# Patient Record
Sex: Female | Born: 1950 | Race: White | Hispanic: No | Marital: Married | Smoking: Never smoker
Health system: Southern US, Community
[De-identification: ages and names within clinical notes are randomized; demographics above are authoritative.]

## PROBLEM LIST (undated history)

## (undated) DIAGNOSIS — E785 Hyperlipidemia, unspecified: Secondary | ICD-10-CM

## (undated) DIAGNOSIS — I10 Essential (primary) hypertension: Secondary | ICD-10-CM

## (undated) DIAGNOSIS — J189 Pneumonia, unspecified organism: Secondary | ICD-10-CM

## (undated) DIAGNOSIS — M1990 Unspecified osteoarthritis, unspecified site: Secondary | ICD-10-CM

## (undated) DIAGNOSIS — E559 Vitamin D deficiency, unspecified: Secondary | ICD-10-CM

## (undated) HISTORY — DX: Hyperlipidemia, unspecified: E78.5

## (undated) HISTORY — PX: OTHER SURGICAL HISTORY: SHX169

## (undated) HISTORY — DX: Essential (primary) hypertension: I10

## (undated) HISTORY — PX: LAPAROSCOPY: SHX197

## (undated) HISTORY — PX: TMJ ARTHROSCOPY: SHX1067

---

## 1976-06-27 HISTORY — PX: BREAST EXCISIONAL BIOPSY: SUR124

## 1999-03-17 ENCOUNTER — Other Ambulatory Visit: Admission: RE | Admit: 1999-03-17 | Discharge: 1999-03-17 | Payer: Self-pay | Admitting: Gynecology

## 1999-03-19 ENCOUNTER — Other Ambulatory Visit: Admission: RE | Admit: 1999-03-19 | Discharge: 1999-03-19 | Payer: Self-pay | Admitting: Gynecology

## 1999-08-27 ENCOUNTER — Other Ambulatory Visit: Admission: RE | Admit: 1999-08-27 | Discharge: 1999-08-27 | Payer: Self-pay | Admitting: Gynecology

## 1999-09-21 ENCOUNTER — Other Ambulatory Visit: Admission: RE | Admit: 1999-09-21 | Discharge: 1999-09-21 | Payer: Self-pay | Admitting: Gynecology

## 2000-04-28 ENCOUNTER — Other Ambulatory Visit: Admission: RE | Admit: 2000-04-28 | Discharge: 2000-04-28 | Payer: Self-pay | Admitting: Gynecology

## 2001-08-28 ENCOUNTER — Other Ambulatory Visit: Admission: RE | Admit: 2001-08-28 | Discharge: 2001-08-28 | Payer: Self-pay | Admitting: Gynecology

## 2002-07-20 ENCOUNTER — Ambulatory Visit (HOSPITAL_COMMUNITY): Admission: RE | Admit: 2002-07-20 | Discharge: 2002-07-20 | Payer: Self-pay | Admitting: Neurology

## 2002-07-20 ENCOUNTER — Encounter: Payer: Self-pay | Admitting: Neurology

## 2002-08-28 ENCOUNTER — Other Ambulatory Visit: Admission: RE | Admit: 2002-08-28 | Discharge: 2002-08-28 | Payer: Self-pay | Admitting: Gynecology

## 2004-01-12 ENCOUNTER — Other Ambulatory Visit: Admission: RE | Admit: 2004-01-12 | Discharge: 2004-01-12 | Payer: Self-pay | Admitting: Gynecology

## 2005-06-16 ENCOUNTER — Other Ambulatory Visit: Admission: RE | Admit: 2005-06-16 | Discharge: 2005-06-16 | Payer: Self-pay | Admitting: Gynecology

## 2006-09-04 ENCOUNTER — Other Ambulatory Visit: Admission: RE | Admit: 2006-09-04 | Discharge: 2006-09-04 | Payer: Self-pay | Admitting: Gynecology

## 2010-12-14 ENCOUNTER — Other Ambulatory Visit: Payer: Self-pay | Admitting: Orthopedic Surgery

## 2010-12-14 DIAGNOSIS — M25511 Pain in right shoulder: Secondary | ICD-10-CM

## 2011-01-03 ENCOUNTER — Ambulatory Visit
Admission: RE | Admit: 2011-01-03 | Discharge: 2011-01-03 | Disposition: A | Discharge status: Home / Self Care | Payer: BC Managed Care – PPO | Source: Ambulatory Visit | Attending: Orthopedic Surgery | Admitting: Orthopedic Surgery

## 2011-01-03 ENCOUNTER — Other Ambulatory Visit: Payer: Self-pay

## 2011-01-03 DIAGNOSIS — M25511 Pain in right shoulder: Secondary | ICD-10-CM

## 2016-06-14 DIAGNOSIS — E785 Hyperlipidemia, unspecified: Secondary | ICD-10-CM | POA: Insufficient documentation

## 2016-06-14 DIAGNOSIS — I10 Essential (primary) hypertension: Secondary | ICD-10-CM | POA: Insufficient documentation

## 2016-08-22 ENCOUNTER — Other Ambulatory Visit: Payer: Self-pay | Admitting: Physician Assistant

## 2016-08-22 DIAGNOSIS — Z1231 Encounter for screening mammogram for malignant neoplasm of breast: Secondary | ICD-10-CM

## 2016-09-07 ENCOUNTER — Encounter: Payer: Self-pay | Admitting: Radiology

## 2016-09-07 ENCOUNTER — Ambulatory Visit
Admission: RE | Admit: 2016-09-07 | Discharge: 2016-09-07 | Disposition: A | Discharge status: Home / Self Care | Payer: BLUE CROSS/BLUE SHIELD | Source: Ambulatory Visit | Attending: Physician Assistant | Admitting: Physician Assistant

## 2016-09-07 DIAGNOSIS — Z1231 Encounter for screening mammogram for malignant neoplasm of breast: Secondary | ICD-10-CM

## 2018-05-22 ENCOUNTER — Other Ambulatory Visit: Payer: Self-pay | Admitting: Nurse Practitioner

## 2018-05-22 ENCOUNTER — Ambulatory Visit: Payer: Self-pay | Admitting: Nurse Practitioner

## 2018-05-22 VITALS — BP 128/80 | HR 94 | Temp 98.3°F | Resp 16 | Ht 64.0 in | Wt 194.5 lb

## 2018-05-22 DIAGNOSIS — J069 Acute upper respiratory infection, unspecified: Secondary | ICD-10-CM

## 2018-05-22 DIAGNOSIS — H6591 Unspecified nonsuppurative otitis media, right ear: Secondary | ICD-10-CM

## 2018-05-22 MED ORDER — FEXOFENADINE HCL 180 MG PO TABS: 180.0000 mg | ORAL_TABLET | Freq: Every day | ORAL | Status: DC

## 2018-05-22 MED ORDER — AZITHROMYCIN 250 MG PO TABS: ORAL_TABLET | ORAL | 0 refills | Status: DC

## 2018-05-22 MED ORDER — FLUTICASONE PROPIONATE 50 MCG/ACT NA SUSP: NASAL | 0 refills | Status: DC

## 2018-05-22 NOTE — Progress Notes (Signed)
Saw patient earlier today and was given written zpack Rx. Reports that Rx written Rx given will not allow pharmacist to change to generic. She is to do the 72 hour WASP and if ineffective with other treatment regimen to start this med and take as directed. Understands this and [facility]

## 2018-05-22 NOTE — Patient Instructions (Addendum)
If no improvement in 72 hours take zpack as directed (handwritten Rx given) Increase fluids and rest Keep hands washed and houuse wiped down with lysol Take meds as directed RTC if symptoms persist or worsen

## 2018-05-22 NOTE — Progress Notes (Signed)
   Subjective:    Patient ID: Susan Snow, female    DOB: September 07, 1950, 67 y.o.   MRN: [id number]  HPI Susan Snow comes to the clinic today for c/o headache, bilateral ear pressure worse on the left, and PND x 2 days. Pressure is worse when she lies down. She denies any self treatment, fever or dizziness.    Review of Systems  Constitutional: Negative for fatigue and fever.  HENT: Positive for postnasal drip. Negative for ear pain, hearing loss, nosebleeds, sinus pressure and sinus pain.        Bilateral ear pressure  Neurological: Negative for dizziness.       Objective:   Physical Exam  Constitutional: She is oriented to person, place, and time. She appears well-developed and well-nourished.  HENT:  Head: Normocephalic and atraumatic.  Right Ear: External ear normal.  Left Ear: External ear normal.  Nose: Nose normal.  No frontal or maxillary sinus tenderness on palpation Bilateral ears with serous fluid to intact TM and bulging and right mildly injected Pharynx: mildly injected with cobblestoning  Neck: Normal range of motion. Neck supple. No tracheal deviation present.  Cardiovascular: Normal rate, regular rhythm and normal heart sounds.  Pulmonary/Chest: Effort normal and breath sounds normal. No respiratory distress.  Abdominal: Soft. Bowel sounds are normal.  Musculoskeletal: Normal range of motion.  Lymphadenopathy:    She has cervical adenopathy.  Neurological: She is alert and oriented to person, place, and time.  Skin: Skin is warm and dry.  Psychiatric: She has a normal mood and affect.  Vitals reviewed.         Assessment & Plan:

## 2019-05-26 ENCOUNTER — Encounter (HOSPITAL_COMMUNITY): Payer: Self-pay | Admitting: Pharmacy Technician

## 2019-05-26 ENCOUNTER — Ambulatory Visit (INDEPENDENT_AMBULATORY_CARE_PROVIDER_SITE_OTHER)
Admission: EM | Admit: 2019-05-26 | Discharge: 2019-05-26 | Disposition: A | Discharge status: Other Acute Inpatient Hospital | Payer: PRIVATE HEALTH INSURANCE | Source: Home / Self Care

## 2019-05-26 ENCOUNTER — Other Ambulatory Visit: Payer: Self-pay

## 2019-05-26 ENCOUNTER — Encounter (HOSPITAL_COMMUNITY): Payer: Self-pay

## 2019-05-26 ENCOUNTER — Inpatient Hospital Stay (HOSPITAL_COMMUNITY)
Admission: EM | Admit: 2019-05-26 | Discharge: 2019-05-29 | DRG: 483 | Disposition: A | Discharge status: Home / Self Care | Payer: PRIVATE HEALTH INSURANCE | Attending: Orthopaedic Surgery | Admitting: Orthopaedic Surgery

## 2019-05-26 ENCOUNTER — Ambulatory Visit (INDEPENDENT_AMBULATORY_CARE_PROVIDER_SITE_OTHER): Payer: PRIVATE HEALTH INSURANCE

## 2019-05-26 DIAGNOSIS — I10 Essential (primary) hypertension: Secondary | ICD-10-CM | POA: Diagnosis present

## 2019-05-26 DIAGNOSIS — Z09 Encounter for follow-up examination after completed treatment for conditions other than malignant neoplasm: Secondary | ICD-10-CM

## 2019-05-26 DIAGNOSIS — S43015A Anterior dislocation of left humerus, initial encounter: Secondary | ICD-10-CM | POA: Diagnosis present

## 2019-05-26 DIAGNOSIS — M25512 Pain in left shoulder: Secondary | ICD-10-CM | POA: Diagnosis not present

## 2019-05-26 DIAGNOSIS — S43005A Unspecified dislocation of left shoulder joint, initial encounter: Secondary | ICD-10-CM

## 2019-05-26 DIAGNOSIS — S42142A Displaced fracture of glenoid cavity of scapula, left shoulder, initial encounter for closed fracture: Principal | ICD-10-CM | POA: Diagnosis present

## 2019-05-26 DIAGNOSIS — Z79899 Other long term (current) drug therapy: Secondary | ICD-10-CM

## 2019-05-26 DIAGNOSIS — S4292XA Fracture of left shoulder girdle, part unspecified, initial encounter for closed fracture: Secondary | ICD-10-CM | POA: Diagnosis present

## 2019-05-26 DIAGNOSIS — W19XXXA Unspecified fall, initial encounter: Secondary | ICD-10-CM

## 2019-05-26 DIAGNOSIS — W010XXA Fall on same level from slipping, tripping and stumbling without subsequent striking against object, initial encounter: Secondary | ICD-10-CM | POA: Diagnosis present

## 2019-05-26 DIAGNOSIS — Z7982 Long term (current) use of aspirin: Secondary | ICD-10-CM

## 2019-05-26 DIAGNOSIS — S43006A Unspecified dislocation of unspecified shoulder joint, initial encounter: Secondary | ICD-10-CM | POA: Diagnosis present

## 2019-05-26 DIAGNOSIS — E785 Hyperlipidemia, unspecified: Secondary | ICD-10-CM | POA: Diagnosis present

## 2019-05-26 DIAGNOSIS — Z20828 Contact with and (suspected) exposure to other viral communicable diseases: Secondary | ICD-10-CM | POA: Diagnosis present

## 2019-05-26 MED ORDER — PROPOFOL 10 MG/ML IV BOLUS
0.5000 mg/kg | Freq: Once | INTRAVENOUS | Status: DC
  Filled 2019-05-26: qty 20

## 2019-05-26 MED ORDER — FENTANYL CITRATE (PF) 100 MCG/2ML IJ SOLN
50.0000 ug | Freq: Once | INTRAMUSCULAR | Status: AC
  Administered 2019-05-26: 50 ug via INTRAVENOUS
  Filled 2019-05-26: qty 2

## 2019-05-26 MED ORDER — FENTANYL CITRATE (PF) 100 MCG/2ML IJ SOLN
50.0000 ug | Freq: Once | INTRAMUSCULAR | Status: AC
  Administered 2019-05-29 (×2): 50 ug via INTRAVENOUS
  Filled 2019-05-26: qty 2

## 2019-05-26 MED ORDER — PROPOFOL 10 MG/ML IV BOLUS
INTRAVENOUS | Status: AC | PRN
  Administered 2019-05-26 (×2): 30 mg via INTRAVENOUS
  Administered 2019-05-26: 20 mg via INTRAVENOUS
  Administered 2019-05-26: 50 mg via INTRAVENOUS

## 2019-05-26 NOTE — ED Triage Notes (Signed)
Pt states she was walking and almost feel she caught and hurt her left shoulder this afternoon. Pt sates having left shoulder pain, left arm pain and numbness in her left axilla.

## 2019-05-26 NOTE — ED Provider Notes (Signed)
[facility]    CSN: [id number] Arrival date & time: 05/26/19  1658      History   Chief Complaint Chief Complaint  Patient presents with  . Shoulder Pain  . Arm Pain  . Numbness    HPI Susan Snow is a 68 y.o. female.   This is an initial visit for this 68 year old woman who fell this afternoon.  Pt states she was walking and almost feel she caught and hurt her left shoulder this afternoon. Pt sates having left shoulder pain, left arm pain and numbness in her left axilla.   Pain is excruciating and patient is unable to move her shoulder without significant pain.  No other injuries reported.     History reviewed. No pertinent past medical history.  Patient Active Problem List   Diagnosis Date Noted  . Hyperlipidemia 06/14/2016  . Hypertension 06/14/2016    Past Surgical History:  Procedure Laterality Date  . BREAST EXCISIONAL BIOPSY Right 1978    OB History   No obstetric history on file.      Home Medications    Prior to Admission medications   Medication Sig Start Date End Date Taking? Authorizing Provider  aspirin EC 81 MG tablet Take by mouth.    [provider]  Calcium Carbonate-Vitamin D 600-200 MG-UNIT TABS Take by mouth.    [provider]  fexofenadine (ALLEGRA) 180 MG tablet Take 1 tablet (180 mg total) by mouth daily. 05/22/18   Marnette Burgess, NP  fluticasone (FLONASE) 50 MCG/ACT nasal spray 1 spray to both nostrils twice daily until symptoms resolve 05/22/18   Marnette Burgess, NP  olmesartan (BENICAR) 20 MG tablet Take 20 mg by mouth daily. 02/16/18   [provider]  simvastatin (ZOCOR) 40 MG tablet Take 40 mg by mouth daily. 02/16/18   [provider]  vitamin B-12 (CYANOCOBALAMIN) 500 MCG tablet Take 500 mcg by mouth daily.    [provider]    Family History History reviewed. No pertinent family history.  Social History Social History   Tobacco Use  . Smoking  status: Never Smoker  . Smokeless tobacco: Never Used  Substance Use Topics  . Alcohol use: Yes    Alcohol/week: 1.0 standard drinks    Types: 1 Glasses of wine per week  . Drug use: Not on file     Allergies   Patient has no known allergies.   Review of Systems Review of Systems  All other systems reviewed and are negative.    Physical Exam Triage Vital Signs ED Triage Vitals  Enc Vitals Group     BP 05/26/19 1730 (!) 152/94     Pulse Rate 05/26/19 1730 77     Resp 05/26/19 1730 17     Temp 05/26/19 1730 97.6 F (36.4 C)     Temp Source 05/26/19 1730 Oral     SpO2 05/26/19 1730 96 %     Weight --      Height --      Head Circumference --      Peak Flow --      Pain Score 05/26/19 1727 10     Pain Loc --      Pain Edu? --      Excl. in GC? --    No data found.  Updated Vital Signs BP (!) 152/94 (BP Location: Right Arm)   Pulse 77   Temp 97.6 F (36.4 C) (Oral)   Resp 17   SpO2 96%  Physical Exam Vitals signs and nursing note reviewed.  Constitutional:      Appearance: Normal appearance. She is obese.  Neck:     Musculoskeletal: Normal range of motion and neck supple.  Cardiovascular:     Rate and Rhythm: Normal rate.  Pulmonary:     Effort: Pulmonary effort is normal.  Musculoskeletal:        General: Swelling, tenderness and signs of injury present.     Comments: Unable to move left shoulder without pain.  Diffusely tender posterior and anterior joint line with some moderate swelling apparent.  Skin:    General: Skin is warm and dry.  Neurological:     General: No focal deficit present.     Mental Status: She is alert.  Psychiatric:        Mood and Affect: Mood normal.        Behavior: Behavior normal.      UC Treatments / Results  Labs (all labs ordered are listed, but only abnormal results are displayed) Labs Reviewed - No data to display  EKG   Radiology Left shoulder film:  Dislocated with suspected nondisplaced humerus fx  Procedures Procedures (including critical care time)  Medications Ordered in UC Medications - No data to display  Initial Impression / Assessment and Plan / UC Course  I have reviewed the triage vital signs and the nursing notes.  Pertinent labs & imaging results that were available during my care of the patient were reviewed by me and considered in my medical decision making (see chart for details).    Final Clinical Impressions(s) / UC Diagnoses   Final diagnoses:  Dislocated shoulder, left, initial encounter     Discharge Instructions     Patient sent directly to ED with nurses calling ahead    ED Prescriptions    None     I have reviewed the PDMP during this encounter.   Robyn Haber, MD 05/26/19 1815

## 2019-05-26 NOTE — ED Triage Notes (Signed)
Pt arrives from UC with reports of +L shoulder dislocation and 2 fx after mechanical fall today. CNS intact.

## 2019-05-26 NOTE — ED Provider Notes (Signed)
[facility] EMERGENCY DEPARTMENT Provider Note   CSN: [id number] Arrival date & time: 05/26/19  1819     History   Chief Complaint Chief Complaint  Patient presents with   Shoulder Pain    +dislocation    HPI Susan Snow is a 68 y.o. female.     Susan Snow is a 68 y.o. female with a history of hypertension, hyperlipidemia, who presents to the ED from urgent care for evaluation of left shoulder dislocation with associated fractures noted on x-ray.  Patient reports that around 1:00 today she was at a store, she was walking up a ramp and tripped over a tread strip causing her to fall forward, she caught herself on her outstretched left arm and had immediate pain to the left shoulder.  Since then she has been unable to move the shoulder.  She initially went home and tried to take Tylenol and ice it but pain did not improve and she did not have improvement in her mobility so she presented to urgent care.  She has had some intermittent tingling in the arm but no numbness.  She denies any other injuries from the fall.  Did not hit her head, denies neck pain.  Reports that she bumped her left knee but has been ambulatory on the knee without difficulty and denies swelling in this area.  Aside from Tylenol no other meds prior to arrival to treat pain.  She has had previous rotator cuff surgery on the right but no previous injury or surgery to the left shoulder.  She has previously seen Dr. Madelon Lips with [facility].  Last ate and drank at noon.     History reviewed. No pertinent past medical history.  Patient Active Problem List   Diagnosis Date Noted   Hyperlipidemia 06/14/2016   Hypertension 06/14/2016    Past Surgical History:  Procedure Laterality Date   BREAST EXCISIONAL BIOPSY Right 1978     OB History   No obstetric history on file.      Home Medications    Prior to Admission medications   Medication Sig Start Date End Date Taking?  Authorizing Provider  acetaminophen (TYLENOL) 325 MG tablet Take 650 mg by mouth once.   Yes [provider]  aspirin EC 81 MG tablet Take 81 mg by mouth every morning.    Yes [provider]  Multiple Vitamins-Minerals (ADULT ONE DAILY GUMMIES) CHEW Chew 2 tablets by mouth every morning.   Yes [provider]  Multiple Vitamins-Minerals (PRESERVISION AREDS 2) CAPS Take 1 capsule by mouth every other day.   Yes [provider]  naproxen sodium (ALEVE) 220 MG tablet Take 220 mg by mouth at bedtime as needed (pain).   Yes [provider]  olmesartan (BENICAR) 20 MG tablet Take 20 mg by mouth every morning.  02/16/18  Yes [provider]  simvastatin (ZOCOR) 40 MG tablet Take 40 mg by mouth at bedtime.  02/16/18  Yes [provider]  vitamin B-12 (CYANOCOBALAMIN) 500 MCG tablet Take 500 mcg by mouth every morning.    Yes [provider]  fexofenadine (ALLEGRA) 180 MG tablet Take 1 tablet (180 mg total) by mouth daily. Patient not taking: Reported on 05/26/2019 05/22/18   Marnette Burgess, NP  fluticasone [facility]) 50 MCG/ACT nasal spray 1 spray to both nostrils twice daily until symptoms resolve Patient not taking: Reported on 05/26/2019 05/22/18   Marnette Burgess, NP    Family History No family history on  file.  Social History Social History   Tobacco Use   Smoking status: Never Smoker   Smokeless tobacco: Never Used  Substance Use Topics   Alcohol use: Yes    Alcohol/week: 1.0 standard drinks    Types: 1 Glasses of wine per week   Drug use: Not on file     Allergies   Patient has no known allergies.   Review of Systems Review of Systems  Constitutional: Negative for chills and fever.  Musculoskeletal: Positive for arthralgias and joint swelling. Negative for back pain and neck pain.  Skin: Negative for color change and wound.  Neurological: Negative for syncope, light-headedness and headaches.  All  other systems reviewed and are negative.    Physical Exam Updated Vital Signs BP (!) 166/91 (BP Location: Right Arm)    Pulse 78    Temp 98 F (36.7 C) (Oral)    Resp 20    Ht 5\' 5"  (1.651 m)    Wt 86.2 kg    SpO2 98%    BMI 31.62 kg/m   Physical Exam Vitals signs and nursing note reviewed.  Constitutional:      General: She is not in acute distress.    Appearance: Normal appearance. She is well-developed and normal weight. She is not ill-appearing or diaphoretic.  HENT:     Head: Normocephalic and atraumatic.  Eyes:     General:        Right eye: No discharge.        Left eye: No discharge.  Neck:     Musculoskeletal: Neck supple. No muscular tenderness.     Comments: No midline C-spine tenderness. Cardiovascular:     Rate and Rhythm: Normal rate and regular rhythm.     Pulses: Normal pulses.     Heart sounds: Normal heart sounds.  Pulmonary:     Effort: Pulmonary effort is normal. No respiratory distress.     Breath sounds: Normal breath sounds.     Comments: Respirations equal and unlabored, patient able to speak in full sentences, lungs clear to auscultation bilaterally Musculoskeletal:     Comments: Tenderness to palpation over the left shoulder with obvious swelling and deformity.  Pain is most significant over the superior shoulder and deltoid.  Unable to move shoulder.  Denies pain at the elbow forearm or wrist.  2+ radial pulse.  Normal sensation.  5/5 grip strength.  Skin:    General: Skin is warm and dry.     Capillary Refill: Capillary refill takes less than 2 seconds.  Neurological:     Mental Status: She is alert and oriented to person, place, and time.     Coordination: Coordination normal.  Psychiatric:        Mood and Affect: Mood normal.        Behavior: Behavior normal.      ED Treatments / Results  Labs (all labs ordered are listed, but only abnormal results are displayed) Labs Reviewed - No data to display  EKG None  Radiology Dg Shoulder  Left  Result Date: 05/26/2019 CLINICAL DATA:  Pain after fall EXAM: LEFT SHOULDER - 2+ VIEW COMPARISON:  None. FINDINGS: An anterior dislocation is identified. There is irregularity near the greater tubercle as well as 2 linear lucencies projected over the proximal humerus. There is also irregularity of the glenoid, particularly inferiorly. A lipohemarthrosis is seen on oblique imaging with a fat fluid level. The clavicle is intact. There is mild irregularity of the acromion as well. The remainder  of the scapula is intact. Limited views of the left chest are normal. IMPRESSION: 1. Anterior dislocation. 2. Large fat fluid level in the left shoulder consistent with a lipohemarthrosis consistent with an underlying fracture. There is irregularity of the greater tubercle as well as 2 linear lucencies projected over the proximal humerus suggesting fracture of the proximal humerus. There is irregularity of the glenoid as well suggesting fracture. There is irregularity of the a chromium which is less specific. Recommend CT imaging for better evaluation. Electronically Signed   By: Gerome Samavid  Williams III M.D   On: 05/26/2019 18:14   Dg Shoulder Left Portable  Result Date: 05/27/2019 CLINICAL DATA:  Postreduction EXAM: LEFT SHOULDER COMPARISON:  05/26/2019 FINDINGS: Interval reduction of the previously seen dislocated left shoulder. Large Hill-Sachs deformity noted in the superolateral left humeral head. IMPRESSION: Interval reduction.  Large Hill-Sachs deformity. Electronically Signed   By: Charlett NoseKevin  Dover M.D.   On: 05/27/2019 00:10    Procedures .Ortho Injury Treatment  Date/Time: 05/27/2019 12:28 AM Performed by: Dartha LodgeFord, Cienna Dumais N, PA-C Authorized by: Dartha LodgeFord, Raeleigh Guinn N, PA-C   Consent:    Consent obtained:  Verbal   Consent given by:  Patient   Risks discussed:  Fracture, recurrent dislocation, irreducible dislocation, stiffness, restricted joint movement, nerve damage and vascular damage   Alternatives  discussed:  No treatmentInjury location: shoulder Location details: left shoulder Injury type: fracture-dislocation Dislocation type: anterior Fracture type: greater humeral tuberosity Pre-procedure neurovascular assessment: neurovascularly intact Pre-procedure distal perfusion: normal Pre-procedure neurological function: normal Pre-procedure range of motion: reduced  Anesthesia: Local anesthesia used: no  Patient sedated: Yes. Refer to sedation procedure documentation for details of sedation. Manipulation performed: yes Skeletal traction used: yes Reduction successful: yes X-ray confirmed reduction: yes Immobilization: sling (Left shoulder immobilizer) Post-procedure neurovascular assessment: post-procedure neurovascularly intact Post-procedure distal perfusion: normal Post-procedure neurological function: normal Post-procedure range of motion comment: Shoulder unstable so ROM avoided Patient tolerance: patient tolerated the procedure well with no immediate complications    (including critical care time)  Medications Ordered in ED Medications  fentaNYL (SUBLIMAZE) injection 50 mcg (50 mcg Intravenous Given 05/26/19 2236)     Initial Impression / Assessment and Plan / ED Course  I have reviewed the triage vital signs and the nursing notes.  Pertinent labs & imaging results that were available during my care of the patient were reviewed by me and considered in my medical decision making (see chart for details).  68 year old female presents from urgent care for evaluation of left anterior shoulder dislocation associated with fracture after mechanical fall today.  X-rays completed at urgent care show an anterior dislocation as well as a large fat fluid level consistent with lipohemarthrosis, there is an irregularity of the greater tubercle as well as 2 linear lucencies over the proximal humerus suggestive of fracture.  There is also an irregularity of the glenoid suggesting  fracture as well.  Patient given fentanyl for pain which is described as a 9 out of 10.  She is unable to move the shoulder but has 2+ distal pulses and normal sensation.  Will discuss with orthopedics prior to reduction or further imaging.  Case discussed with Dr. Carola FrostHandy with orthopedics who reviewed patient's x-rays and recommends attempting left shoulder reduction with good sedation.  After post reduction films that confirm shoulder is in place he recommends getting CT of the left shoulder to better evaluate potential areas of fracture.  He reports this will be more valuable to have post-reduction.  Discussed this plan with  the patient who is in agreement.  Conscious sedation performed by Dr. Madilyn Hook with propofol.  Left shoulder reduction performed by myself and Dr. Madilyn Hook. Shoulder is unstable, and was held in place until immobilizer applied. Post reduction films show successful reduction with large Hill-Sachs deformity of the superior lateral left humeral head.  Patient placed in shoulder immobilizer by myself and Ortho tech. CT left shoulder ordered to better characterize associated fracture.  Care signed out to PA Ivar Drape who will follow up on patient's left shoulder CT and discussed with Dr. Carola Frost with orthopedics.  Anticipate discharge with pain medication, discussed with patient also using Tylenol and topical Voltaren gel.  Final Clinical Impressions(s) / ED Diagnoses   Final diagnoses:  Closed fracture dislocation of left shoulder, initial encounter  Fall, initial encounter    ED Discharge Orders    None       Dartha Lodge, [place] 05/27/19 0041    Tilden Fossa, MD 05/28/19 [phone]

## 2019-05-26 NOTE — Discharge Instructions (Addendum)
Patient sent directly to ED with nurses calling ahead

## 2019-05-26 NOTE — ED Notes (Signed)
Patient is being discharged from the [facility] and sent to the Emergency Department via personal vehicle by spouse. Per Dr Joseph Art, patient is stable but in need of higher level of care due to shoulder dislocation. Patient is aware and verbalizes understanding of plan of care.   Vitals:   05/26/19 1730  BP: (!) 152/94  Pulse: 77  Resp: 17  Temp: 97.6 F (36.4 C)  SpO2: 96%

## 2019-05-27 ENCOUNTER — Emergency Department (HOSPITAL_COMMUNITY): Payer: PRIVATE HEALTH INSURANCE

## 2019-05-27 ENCOUNTER — Encounter (HOSPITAL_COMMUNITY): Payer: Self-pay | Admitting: Emergency Medicine

## 2019-05-27 ENCOUNTER — Inpatient Hospital Stay (HOSPITAL_COMMUNITY): Payer: PRIVATE HEALTH INSURANCE

## 2019-05-27 DIAGNOSIS — I10 Essential (primary) hypertension: Secondary | ICD-10-CM | POA: Diagnosis present

## 2019-05-27 DIAGNOSIS — W010XXA Fall on same level from slipping, tripping and stumbling without subsequent striking against object, initial encounter: Secondary | ICD-10-CM | POA: Diagnosis present

## 2019-05-27 DIAGNOSIS — S42142A Displaced fracture of glenoid cavity of scapula, left shoulder, initial encounter for closed fracture: Secondary | ICD-10-CM | POA: Diagnosis present

## 2019-05-27 DIAGNOSIS — Z20828 Contact with and (suspected) exposure to other viral communicable diseases: Secondary | ICD-10-CM | POA: Diagnosis present

## 2019-05-27 DIAGNOSIS — E785 Hyperlipidemia, unspecified: Secondary | ICD-10-CM | POA: Diagnosis present

## 2019-05-27 DIAGNOSIS — Z7982 Long term (current) use of aspirin: Secondary | ICD-10-CM | POA: Diagnosis not present

## 2019-05-27 DIAGNOSIS — S43006A Unspecified dislocation of unspecified shoulder joint, initial encounter: Secondary | ICD-10-CM | POA: Diagnosis present

## 2019-05-27 DIAGNOSIS — Z79899 Other long term (current) drug therapy: Secondary | ICD-10-CM | POA: Diagnosis not present

## 2019-05-27 DIAGNOSIS — S43015A Anterior dislocation of left humerus, initial encounter: Secondary | ICD-10-CM | POA: Diagnosis present

## 2019-05-27 DIAGNOSIS — M25512 Pain in left shoulder: Secondary | ICD-10-CM | POA: Diagnosis present

## 2019-05-27 DIAGNOSIS — S4292XA Fracture of left shoulder girdle, part unspecified, initial encounter for closed fracture: Secondary | ICD-10-CM | POA: Diagnosis present

## 2019-05-27 LAB — CBC
HCT: 40.9 % (ref 36.0–46.0)
Hemoglobin: 13.9 g/dL (ref 12.0–15.0)
MCH: 32.6 pg (ref 26.0–34.0)
MCHC: 34 g/dL (ref 30.0–36.0)
MCV: 95.8 fL (ref 80.0–100.0)
Platelets: 242 10*3/uL (ref 150–400)
RBC: 4.27 MIL/uL (ref 3.87–5.11)
RDW: 11.3 % — ABNORMAL LOW (ref 11.5–15.5)
WBC: 8.4 10*3/uL (ref 4.0–10.5)
nRBC: 0 % (ref 0.0–0.2)

## 2019-05-27 LAB — BASIC METABOLIC PANEL
Anion gap: 12 (ref 5–15)
BUN: 11 mg/dL (ref 8–23)
CO2: 24 mmol/L (ref 22–32)
Calcium: 8.9 mg/dL (ref 8.9–10.3)
Chloride: 106 mmol/L (ref 98–111)
Creatinine, Ser: 0.67 mg/dL (ref 0.44–1.00)
GFR calc Af Amer: >60 mL/min (ref >60)
GFR calc non Af Amer: >60 mL/min (ref >60)
Glucose, Bld: 114 mg/dL — ABNORMAL HIGH (ref 70–99)
Potassium: 3.9 mmol/L (ref 3.5–5.1)
Sodium: 142 mmol/L (ref 135–145)

## 2019-05-27 LAB — MRSA PCR SCREENING: MRSA by PCR: NEGATIVE

## 2019-05-27 LAB — SARS CORONAVIRUS 2 (TAT 6-24 HRS): SARS Coronavirus 2: NEGATIVE

## 2019-05-27 MED ORDER — FENTANYL CITRATE (PF) 100 MCG/2ML IJ SOLN
50.0000 ug | Freq: Once | INTRAMUSCULAR | Status: AC
  Administered 2019-05-27: 02:00:00 50 ug via INTRAVENOUS
  Filled 2019-05-27: qty 2

## 2019-05-27 MED ORDER — OLMESARTAN MEDOXOMIL 20 MG PO TABS
20.0000 mg | ORAL_TABLET | Freq: Every day | ORAL | Status: DC
  Administered 2019-05-28 – 2019-05-29 (×2): 20 mg via ORAL
  Filled 2019-05-27 (×4): qty 1

## 2019-05-27 MED ORDER — PNEUMOCOCCAL VAC POLYVALENT 25 MCG/0.5ML IJ INJ
0.5000 mL | INJECTION | INTRAMUSCULAR | Status: DC
  Filled 2019-05-27: qty 0.5

## 2019-05-27 MED ORDER — DICLOFENAC SODIUM 1 % EX GEL: 2.0000 g | Freq: Four times a day (QID) | CUTANEOUS | 0 refills | Status: DC

## 2019-05-27 MED ORDER — HYDROMORPHONE HCL 1 MG/ML IJ SOLN
0.5000 mg | INTRAMUSCULAR | Status: AC | PRN
  Administered 2019-05-27 (×3): 0.5 mg via INTRAVENOUS
  Filled 2019-05-27 (×3): qty 1

## 2019-05-27 MED ORDER — DEXTROSE-NACL 5-0.45 % IV SOLN
INTRAVENOUS | Status: AC
  Administered 2019-05-27: 04:00:00 via INTRAVENOUS

## 2019-05-27 MED ORDER — NON FORMULARY: 20.0000 mg | Freq: Every day | Status: DC

## 2019-05-27 MED ORDER — ONDANSETRON HCL 4 MG/2ML IJ SOLN
4.0000 mg | Freq: Three times a day (TID) | INTRAMUSCULAR | Status: AC | PRN
  Administered 2019-05-27: 4 mg via INTRAVENOUS
  Filled 2019-05-27: qty 2

## 2019-05-27 NOTE — H&P (View-Only) (Signed)
ORTHOPAEDIC CONSULTATION  REQUESTING PHYSICIAN: Hiram Gash, MD  Chief Complaint: shoulder pain  HPI: Susan Snow is a 68 y.o. female with a past medical history of hypertension and hyperlipidemia.   Patient was walking outside the store at around 1:00 today and tripped over a tread strip causing her to fall forward. She caught herself on her outstretched left arm and had immediate pain to the left shoulder. She presented to the Urgent Care for evaluation. X-rays showed left shoulder dislocation. She was sent to ED. Shoulder was successfully reduced in the ED. Patient was placed in shoulder immobilizer and admitted to orthopedic service.   Upon evaluation, patient is resting comfortably in her hospital bed. Sling is in place. She reports stiffness of her shoulder, but no significant pain. She has previously had a rotator cuff repair on the right side by Dr. French Ana years ago.   History reviewed. No pertinent past medical history. Past Surgical History:  Procedure Laterality Date  . BREAST EXCISIONAL BIOPSY Right 1978   Social History   Socioeconomic History  . Marital status: Married    Spouse name: Not on file  . Number of children: Not on file  . Years of education: Not on file  . Highest education level: Not on file  Occupational History  . Not on file  Social Needs  . Financial resource strain: Not on file  . Food insecurity    Worry: Not on file    Inability: Not on file  . Transportation needs    Medical: Not on file    Non-medical: Not on file  Tobacco Use  . Smoking status: Never Smoker  . Smokeless tobacco: Never Used  Substance and Sexual Activity  . Alcohol use: Yes    Alcohol/week: 1.0 standard drinks    Types: 1 Glasses of wine per week  . Drug use: Not on file  . Sexual activity: Not on file  Lifestyle  . Physical activity    Days per week: Not on file    Minutes per session: Not on file  . Stress: Not on file  Relationships  . Social  Herbalist on phone: Not on file    Gets together: Not on file    Attends religious service: Not on file    Active member of club or organization: Not on file    Attends meetings of clubs or organizations: Not on file    Relationship status: Not on file  Other Topics Concern  . Not on file  Social History Narrative  . Not on file   No family history on file. No Known Allergies Prior to Admission medications   Medication Sig Start Date End Date Taking? Authorizing Provider  acetaminophen (TYLENOL) 325 MG tablet Take 650 mg by mouth once.   Yes [provider]  aspirin EC 81 MG tablet Take 81 mg by mouth every morning.    Yes [provider]  Multiple Vitamins-Minerals (ADULT ONE DAILY GUMMIES) CHEW Chew 2 tablets by mouth every morning.   Yes [provider]  Multiple Vitamins-Minerals (PRESERVISION AREDS 2) CAPS Take 1 capsule by mouth every other day.   Yes [provider]  naproxen sodium (ALEVE) 220 MG tablet Take 220 mg by mouth at bedtime as needed (pain).   Yes [provider]  olmesartan (BENICAR) 20 MG tablet Take 20 mg by mouth every morning.  02/16/18  Yes [provider]  simvastatin (ZOCOR) 40 MG tablet  Take 40 mg by mouth at bedtime.  02/16/18  Yes [provider]  vitamin B-12 (CYANOCOBALAMIN) 500 MCG tablet Take 500 mcg by mouth every morning.    Yes [provider]  diclofenac Sodium (VOLTAREN) 1 % GEL Apply 2 g topically 4 (four) times daily. 05/27/19   Jacqlyn Larsen, PA-C  fexofenadine (ALLEGRA) 180 MG tablet Take 1 tablet (180 mg total) by mouth daily. Patient not taking: Reported on 05/26/2019 05/22/18   Maury Dus, NP  fluticasone [facility]) 50 MCG/ACT nasal spray 1 spray to both nostrils twice daily until symptoms resolve Patient not taking: Reported on 05/26/2019 05/22/18   Maury Dus, NP   Ct Shoulder Left Wo Contrast  Result Date: 05/27/2019 CLINICAL DATA:   Dislocation, fall, shoulder fracture EXAM: CT OF THE UPPER LEFT EXTREMITY WITHOUT CONTRAST TECHNIQUE: Multidetector CT imaging of the upper left extremity was performed according to the standard protocol. COMPARISON:  Radiograph same day FINDINGS: Bones/Joint/Cartilage The patient is status post reduction of anterior dislocation. The humeral head is well seated within the glenoid. There is a large Hill-Sachs deformity with small osseous fragments seen posteriorly. Tiny osseous fragments are seen within the superior glenohumeral joint space the largest measuring 6 mm seen on series 3, image 30, which could represent chip fractures from the posterior glenoid surface or displaced fragments from the Hill-Sachs deformity. There is a comminuted mildly displaced osseous Bankart lesion which involve the posteroinferior glenoid surface as well. Osseous fragment measuring 9 mm is seen in the posteroinferior glenohumeral joint space. The [facility] joint is intact. No other fractures are identified. A moderate glenohumeral joint effusion is seen. Ligaments Suboptimally assessed by CT. Muscles and Tendons There edema involving the deltoid musculature. Within the subscapularis muscle belly there is a 2.9 cm fat containing lesion which could represent intramuscular lipoma or focal fatty atrophy. The tendons appear to be grossly intact, however suboptimally visualized. Soft tissues There is soft tissue edema surrounding the anterior shoulder and within the axilla. IMPRESSION: 1. Status post reduction of anterior dislocation. 2. Comminuted impacted Hill-Sachs deformity with adjacent small osseous fragments. 3. Tiny osseous fragments in the superior glenohumeral joint space the largest measuring 6 mm which could represent chip fractures from the posterior glenoid surface or displaced osseous fragment from the Hill-Sachs deformity. 4. Comminuted mildly displaced osseous Bankart lesion that also of involves the posterior inferior glenoid  surface. 5. Adjacent osseous fragment measuring 9 mm seen in the posteroinferior glenohumeral joint space. 6. 2.9 cm fat containing lesion within subscapularis muscle belly which could represent intramuscular lipoma or focal fatty atrophy. Electronically Signed   By: Prudencio Pair M.D.   On: 05/27/2019 01:13   Dg Shoulder Left  Result Date: 05/26/2019 CLINICAL DATA:  Pain after fall EXAM: LEFT SHOULDER - 2+ VIEW COMPARISON:  None. FINDINGS: An anterior dislocation is identified. There is irregularity near the greater tubercle as well as 2 linear lucencies projected over the proximal humerus. There is also irregularity of the glenoid, particularly inferiorly. A lipohemarthrosis is seen on oblique imaging with a fat fluid level. The clavicle is intact. There is mild irregularity of the acromion as well. The remainder of the scapula is intact. Limited views of the left chest are normal. IMPRESSION: 1. Anterior dislocation. 2. Large fat fluid level in the left shoulder consistent with a lipohemarthrosis consistent with an underlying fracture. There is irregularity of the greater tubercle as well as 2 linear lucencies projected over the proximal humerus suggesting fracture of the proximal humerus. There  is irregularity of the glenoid as well suggesting fracture. There is irregularity of the a chromium which is less specific. Recommend CT imaging for better evaluation. Electronically Signed   By: Dorise Bullion III M.D   On: 05/26/2019 18:14   Dg Shoulder Left Portable  Result Date: 05/27/2019 CLINICAL DATA:  Postreduction EXAM: LEFT SHOULDER COMPARISON:  05/26/2019 FINDINGS: Interval reduction of the previously seen dislocated left shoulder. Large Hill-Sachs deformity noted in the superolateral left humeral head. IMPRESSION: Interval reduction.  Large Hill-Sachs deformity. Electronically Signed   By: Rolm Baptise M.D.   On: 05/27/2019 00:10   Family History Reviewed and non-contributory, no pertinent history  of problems with bleeding or anesthesia      Review of Systems 14 system ROS conducted and negative except for that noted in HPI   OBJECTIVE  Vitals: General: Alert, no acute distress Cardiovascular: Warm extremities noted Respiratory: No cyanosis, no use of accessory musculature GI: No organomegaly, abdomen is soft and non-tender Skin: No lesions in the area of chief complaint other than those listed below in MSK exam.  Neurologic: Sensation intact distally save for the below mentioned MSK exam Psychiatric: Patient is competent for consent with normal mood and affect Lymphatic: No swelling obvious and reported other than the area involved in the exam below  Extremities: LUE: Sling well fitting. ROM not tested in setting of recent dislocation.  Full and painless ROM throughout hand with DPC of 0. + Motor in  AIN, PIN, Ulnar distributions. Axillary nerve sensation preserved and symmetric.  Sensation intact in medial, radial, and ulnar distributions. Well perfused digits.   Patient moves all other extremities without any pain or difficulty.      Labs cbc Recent Labs    05/27/19 0347  WBC 8.4  HGB 13.9  HCT 40.9  PLT 242    Labs inflam No results for input(s): CRP in the last 72 hours.  Invalid input(s): ESR  Labs coag No results for input(s): INR, PTT in the last 72 hours.  Invalid input(s): PT  Recent Labs    05/27/19 0347  NA 142  K 3.9  CL 106  CO2 24  GLUCOSE 114*  BUN 11  CREATININE 0.67  CALCIUM 8.9     ASSESSMENT AND PLAN: 68 y.o. female with the following: status post reduction of anterior left shoulder dislocation  - Weight Bearing Status/Activity: NWB LUE. Sling.  - Additional recommended tests: MRI left shoulder  - Follow-up plan: Pending results of MRI of the left shoulder.  Patient may require surgery.    Noemi Chapel 05/27/2019

## 2019-05-27 NOTE — Progress Notes (Signed)
Orthopedic Tech Progress Note Patient Details:  Susan Snow 08/24/1950 [id number]  Ortho Devices Type of Ortho Device: Sling immobilizer Ortho Device/Splint Location: lue Ortho Device/Splint Interventions: Ordered, Application, Adjustment   Post Interventions Patient Tolerated: Well Instructions Provided: Care of device, Adjustment of device   Karolee Stamps 05/27/2019, 12:47 AM

## 2019-05-27 NOTE — ED Provider Notes (Signed)
.  Sedation  Date/Time: 05/27/2019 12:22 AM Performed by: Quintella Reichert, MD Authorized by: Quintella Reichert, MD   Consent:    Consent obtained:  Verbal   Consent given by:  Patient   Risks discussed:  Allergic reaction, dysrhythmia, inadequate sedation, nausea, prolonged hypoxia resulting in organ damage, prolonged sedation necessitating reversal, respiratory compromise necessitating ventilatory assistance and intubation and vomiting   Alternatives discussed:  Analgesia without sedation, anxiolysis and regional anesthesia Universal protocol:    Procedure explained and questions answered to patient or proxy's satisfaction: yes     Relevant documents present and verified: yes     Test results available and properly labeled: yes     Imaging studies available: yes     Required blood products, implants, devices, and special equipment available: yes     Site/side marked: yes     Immediately prior to procedure a time out was called: yes     Patient identity confirmation method:  Verbally with patient Indications:    Procedure performed:  Dislocation reduction   Procedure necessitating sedation performed by:  Physician performing sedation Pre-sedation assessment:    Time since last food or drink:  12   ASA classification: class 2 - patient with mild systemic disease     Neck mobility: normal     Mouth opening:  3 or more finger widths   Thyromental distance:  4 finger widths   Mallampati score:  I - soft palate, uvula, fauces, pillars visible   Pre-sedation assessments completed and reviewed: airway patency, cardiovascular function, hydration status, mental status, nausea/vomiting, pain level, respiratory function and temperature   Immediate pre-procedure details:    Reassessment: Patient reassessed immediately prior to procedure     Reviewed: vital signs, relevant labs/tests and NPO status     Verified: bag valve mask available, emergency equipment available, intubation equipment available,  IV patency confirmed, oxygen available and suction available   Procedure details (see MAR for exact dosages):    Preoxygenation:  Nasal cannula   Sedation:  Propofol   Intended level of sedation: deep   Intra-procedure monitoring:  Blood pressure monitoring, cardiac monitor, continuous pulse oximetry, frequent LOC assessments, frequent vital sign checks and continuous capnometry   Intra-procedure events: none     Total Provider sedation time (minutes):  20 Post-procedure details:    Attendance: Constant attendance by certified staff until patient recovered     Recovery: Patient returned to pre-procedure baseline     Post-sedation assessments completed and reviewed: airway patency, cardiovascular function, hydration status, mental status, nausea/vomiting, pain level, respiratory function and temperature     Patient is stable for discharge or admission: yes     Patient tolerance:  Tolerated well, no immediate complications      Quintella Reichert, MD 05/27/19 1444

## 2019-05-27 NOTE — ED Provider Notes (Signed)
Case discussed with Dr. Marcelino Scot, who will accept for admission.  Asks me to place temp orders.  Will see pt. in AM.   Montine Circle, PA-C 05/27/19 [id number]    Merrily Pew, MD 05/27/19 [phone]

## 2019-05-27 NOTE — Consult Note (Addendum)
ORTHOPAEDIC CONSULTATION  REQUESTING PHYSICIAN: Hiram Gash, MD  Chief Complaint: shoulder pain  HPI: Susan Snow is a 68 y.o. female with a past medical history of hypertension and hyperlipidemia.   Patient was walking outside the store at around 1:00 today and tripped over a tread strip causing her to fall forward. She caught herself on her outstretched left arm and had immediate pain to the left shoulder. She presented to the Urgent Care for evaluation. X-rays showed left shoulder dislocation. She was sent to ED. Shoulder was successfully reduced in the ED. Patient was placed in shoulder immobilizer and admitted to orthopedic service.   Upon evaluation, patient is resting comfortably in her hospital bed. Sling is in place. She reports stiffness of her shoulder, but no significant pain. She has previously had a rotator cuff repair on the right side by Dr. French Ana years ago.   History reviewed. No pertinent past medical history. Past Surgical History:  Procedure Laterality Date  . BREAST EXCISIONAL BIOPSY Right 1978   Social History   Socioeconomic History  . Marital status: Married    Spouse name: Not on file  . Number of children: Not on file  . Years of education: Not on file  . Highest education level: Not on file  Occupational History  . Not on file  Social Needs  . Financial resource strain: Not on file  . Food insecurity    Worry: Not on file    Inability: Not on file  . Transportation needs    Medical: Not on file    Non-medical: Not on file  Tobacco Use  . Smoking status: Never Smoker  . Smokeless tobacco: Never Used  Substance and Sexual Activity  . Alcohol use: Yes    Alcohol/week: 1.0 standard drinks    Types: 1 Glasses of wine per week  . Drug use: Not on file  . Sexual activity: Not on file  Lifestyle  . Physical activity    Days per week: Not on file    Minutes per session: Not on file  . Stress: Not on file  Relationships  . Social  Herbalist on phone: Not on file    Gets together: Not on file    Attends religious service: Not on file    Active member of club or organization: Not on file    Attends meetings of clubs or organizations: Not on file    Relationship status: Not on file  Other Topics Concern  . Not on file  Social History Narrative  . Not on file   No family history on file. No Known Allergies Prior to Admission medications   Medication Sig Start Date End Date Taking? Authorizing Provider  acetaminophen (TYLENOL) 325 MG tablet Take 650 mg by mouth once.   Yes [provider]  aspirin EC 81 MG tablet Take 81 mg by mouth every morning.    Yes [provider]  Multiple Vitamins-Minerals (ADULT ONE DAILY GUMMIES) CHEW Chew 2 tablets by mouth every morning.   Yes [provider]  Multiple Vitamins-Minerals (PRESERVISION AREDS 2) CAPS Take 1 capsule by mouth every other day.   Yes [provider]  naproxen sodium (ALEVE) 220 MG tablet Take 220 mg by mouth at bedtime as needed (pain).   Yes [provider]  olmesartan (BENICAR) 20 MG tablet Take 20 mg by mouth every morning.  02/16/18  Yes [provider]  simvastatin (ZOCOR) 40 MG tablet  Take 40 mg by mouth at bedtime.  02/16/18  Yes [provider]  vitamin B-12 (CYANOCOBALAMIN) 500 MCG tablet Take 500 mcg by mouth every morning.    Yes [provider]  diclofenac Sodium (VOLTAREN) 1 % GEL Apply 2 g topically 4 (four) times daily. 05/27/19   Jacqlyn Larsen, PA-C  fexofenadine (ALLEGRA) 180 MG tablet Take 1 tablet (180 mg total) by mouth daily. Patient not taking: Reported on 05/26/2019 05/22/18   Maury Dus, NP  fluticasone [facility]) 50 MCG/ACT nasal spray 1 spray to both nostrils twice daily until symptoms resolve Patient not taking: Reported on 05/26/2019 05/22/18   Maury Dus, NP   Ct Shoulder Left Wo Contrast  Result Date: 05/27/2019 CLINICAL DATA:   Dislocation, fall, shoulder fracture EXAM: CT OF THE UPPER LEFT EXTREMITY WITHOUT CONTRAST TECHNIQUE: Multidetector CT imaging of the upper left extremity was performed according to the standard protocol. COMPARISON:  Radiograph same day FINDINGS: Bones/Joint/Cartilage The patient is status post reduction of anterior dislocation. The humeral head is well seated within the glenoid. There is a large Hill-Sachs deformity with small osseous fragments seen posteriorly. Tiny osseous fragments are seen within the superior glenohumeral joint space the largest measuring 6 mm seen on series 3, image 30, which could represent chip fractures from the posterior glenoid surface or displaced fragments from the Hill-Sachs deformity. There is a comminuted mildly displaced osseous Bankart lesion which involve the posteroinferior glenoid surface as well. Osseous fragment measuring 9 mm is seen in the posteroinferior glenohumeral joint space. The [facility] joint is intact. No other fractures are identified. A moderate glenohumeral joint effusion is seen. Ligaments Suboptimally assessed by CT. Muscles and Tendons There edema involving the deltoid musculature. Within the subscapularis muscle belly there is a 2.9 cm fat containing lesion which could represent intramuscular lipoma or focal fatty atrophy. The tendons appear to be grossly intact, however suboptimally visualized. Soft tissues There is soft tissue edema surrounding the anterior shoulder and within the axilla. IMPRESSION: 1. Status post reduction of anterior dislocation. 2. Comminuted impacted Hill-Sachs deformity with adjacent small osseous fragments. 3. Tiny osseous fragments in the superior glenohumeral joint space the largest measuring 6 mm which could represent chip fractures from the posterior glenoid surface or displaced osseous fragment from the Hill-Sachs deformity. 4. Comminuted mildly displaced osseous Bankart lesion that also of involves the posterior inferior glenoid  surface. 5. Adjacent osseous fragment measuring 9 mm seen in the posteroinferior glenohumeral joint space. 6. 2.9 cm fat containing lesion within subscapularis muscle belly which could represent intramuscular lipoma or focal fatty atrophy. Electronically Signed   By: Prudencio Pair M.D.   On: 05/27/2019 01:13   Dg Shoulder Left  Result Date: 05/26/2019 CLINICAL DATA:  Pain after fall EXAM: LEFT SHOULDER - 2+ VIEW COMPARISON:  None. FINDINGS: An anterior dislocation is identified. There is irregularity near the greater tubercle as well as 2 linear lucencies projected over the proximal humerus. There is also irregularity of the glenoid, particularly inferiorly. A lipohemarthrosis is seen on oblique imaging with a fat fluid level. The clavicle is intact. There is mild irregularity of the acromion as well. The remainder of the scapula is intact. Limited views of the left chest are normal. IMPRESSION: 1. Anterior dislocation. 2. Large fat fluid level in the left shoulder consistent with a lipohemarthrosis consistent with an underlying fracture. There is irregularity of the greater tubercle as well as 2 linear lucencies projected over the proximal humerus suggesting fracture of the proximal humerus. There  is irregularity of the glenoid as well suggesting fracture. There is irregularity of the a chromium which is less specific. Recommend CT imaging for better evaluation. Electronically Signed   By: Dorise Bullion III M.D   On: 05/26/2019 18:14   Dg Shoulder Left Portable  Result Date: 05/27/2019 CLINICAL DATA:  Postreduction EXAM: LEFT SHOULDER COMPARISON:  05/26/2019 FINDINGS: Interval reduction of the previously seen dislocated left shoulder. Large Hill-Sachs deformity noted in the superolateral left humeral head. IMPRESSION: Interval reduction.  Large Hill-Sachs deformity. Electronically Signed   By: Rolm Baptise M.D.   On: 05/27/2019 00:10   Family History Reviewed and non-contributory, no pertinent history  of problems with bleeding or anesthesia      Review of Systems 14 system ROS conducted and negative except for that noted in HPI   OBJECTIVE  Vitals: General: Alert, no acute distress Cardiovascular: Warm extremities noted Respiratory: No cyanosis, no use of accessory musculature GI: No organomegaly, abdomen is soft and non-tender Skin: No lesions in the area of chief complaint other than those listed below in MSK exam.  Neurologic: Sensation intact distally save for the below mentioned MSK exam Psychiatric: Patient is competent for consent with normal mood and affect Lymphatic: No swelling obvious and reported other than the area involved in the exam below  Extremities: LUE: Sling well fitting. ROM not tested in setting of recent dislocation.  Full and painless ROM throughout hand with DPC of 0. + Motor in  AIN, PIN, Ulnar distributions. Axillary nerve sensation preserved and symmetric.  Sensation intact in medial, radial, and ulnar distributions. Well perfused digits.   Patient moves all other extremities without any pain or difficulty.      Labs cbc Recent Labs    05/27/19 0347  WBC 8.4  HGB 13.9  HCT 40.9  PLT 242    Labs inflam No results for input(s): CRP in the last 72 hours.  Invalid input(s): ESR  Labs coag No results for input(s): INR, PTT in the last 72 hours.  Invalid input(s): PT  Recent Labs    05/27/19 0347  NA 142  K 3.9  CL 106  CO2 24  GLUCOSE 114*  BUN 11  CREATININE 0.67  CALCIUM 8.9     ASSESSMENT AND PLAN: 68 y.o. female with the following: status post reduction of anterior left shoulder dislocation  - Weight Bearing Status/Activity: NWB LUE. Sling.  - Additional recommended tests: MRI left shoulder  - Follow-up plan: Pending results of MRI of the left shoulder.  Patient may require surgery.    Noemi Chapel 05/27/2019

## 2019-05-27 NOTE — Plan of Care (Signed)
  Problem: Pain Managment: Goal: General experience of comfort will improve Outcome: Progressing   Problem: Safety: Goal: Ability to remain free from injury will improve Outcome: Progressing   Problem: Skin Integrity: Goal: Risk for impaired skin integrity will decrease Outcome: Progressing   

## 2019-05-28 MED ORDER — TRANEXAMIC ACID-NACL 1000-0.7 MG/100ML-% IV SOLN
1000.0000 mg | INTRAVENOUS | Status: AC
  Administered 2019-05-29: 1000 mg via INTRAVENOUS
  Filled 2019-05-28: qty 100

## 2019-05-28 MED ORDER — CHLORHEXIDINE GLUCONATE 4 % EX LIQD: 60.0000 mL | Freq: Once | CUTANEOUS | Status: DC

## 2019-05-28 MED ORDER — ACETAMINOPHEN 500 MG PO TABS
1000.0000 mg | ORAL_TABLET | Freq: Three times a day (TID) | ORAL | Status: DC
  Administered 2019-05-28 – 2019-05-29 (×3): 1000 mg via ORAL
  Filled 2019-05-28 (×4): qty 2

## 2019-05-28 MED ORDER — CHLORHEXIDINE GLUCONATE 4 % EX LIQD
60.0000 mL | Freq: Once | CUTANEOUS | Status: DC
  Filled 2019-05-28: qty 60

## 2019-05-28 MED ORDER — ONDANSETRON 4 MG PO TBDP
4.0000 mg | ORAL_TABLET | Freq: Three times a day (TID) | ORAL | Status: DC | PRN
  Administered 2019-05-28: 4 mg via ORAL
  Filled 2019-05-28: qty 1

## 2019-05-28 MED ORDER — TRANEXAMIC ACID-NACL 1000-0.7 MG/100ML-% IV SOLN: 1000.0000 mg | INTRAVENOUS | Status: DC

## 2019-05-28 MED ORDER — CEFAZOLIN SODIUM-DEXTROSE 2-4 GM/100ML-% IV SOLN
2.0000 g | INTRAVENOUS | Status: AC
  Administered 2019-05-29: 2 g via INTRAVENOUS
  Filled 2019-05-28: qty 100

## 2019-05-28 NOTE — Progress Notes (Addendum)
    ORTHOPAEDIC PROGRESS NOTE  SUBJECTIVE: Patient is resting in her hospital bed. She reports she has felt nauseous over night, but denies any severe pain in her left shoulder. She notes her left shoulder feels stiff. MRI was performed last night, so she has been waiting for the results to see if she needs surgery or not. She denies any chest pain, SOB, or nausea/vomiting. No other complaints.  OBJECTIVE: PE: LUE: Sling well fitting. ROM not tested in setting of recent dislocation.  Full and painless ROM throughout hand with DPC of 0. + Motor in  AIN, PIN, Ulnar distributions. Axillary nerve sensation preserved and symmetric.  Sensation intact in medial, radial, and ulnar distributions. Well perfused digits.   Vitals:   05/28/19 0342 05/28/19 0802  BP: (!) 143/80 138/74  Pulse: 77 81  Resp: 17 18  Temp: (!) 97.2 F (36.2 C) 98.2 F (36.8 C)  SpO2: 90% 94%     ASSESSMENT: Susan Snow is a 68 y.o. female status post anterior left shoulder dislocation, resulting in Hill-sachs impaction fracture, osseous Bankart, and full thickness tear of supraspinatus tendon.   PLAN: Weightbearing: NWB LUE Orthopedic device(s): Sling Plan: Left reverse total shoulder arthroplasty at [facility] on 05/29/2019. Patient to be transferred to [facility] in preparation for surgery tomorrow.   The risks benefits and alternatives were discussed with the patient including but not limited to the risks of nonoperative treatment, versus surgical intervention including infection, bleeding, nerve injury,  blood clots, cardiopulmonary complications, morbidity, mortality, among others, and they were willing to proceed.   We additionally specifically discussed risks of axillary nerve injury, infection, periprosthetic fracture, continued pain and longevity of implants prior to beginning procedure.   The patient acknowledged the explanation, agreed to proceed with the plan and consent was signed.    Noemi Chapel, PA-C 05/28/2019

## 2019-05-28 NOTE — Progress Notes (Signed)
Pt transferred to [facility] with belongings via [place].

## 2019-05-28 NOTE — Progress Notes (Signed)
Called [facility], gave report to [place], Therapist, sports. [facility] called to set up transport. Pt not in distress, all questions and concerns addressed.

## 2019-05-28 NOTE — Plan of Care (Signed)

## 2019-05-28 NOTE — Plan of Care (Signed)
  Problem: Clinical Measurements: Goal: Ability to maintain clinical measurements within normal limits will improve Outcome: Progressing   Problem: Activity: Goal: Risk for activity intolerance will decrease Outcome: Progressing   Problem: Nutrition: Goal: Adequate nutrition will be maintained Outcome: Progressing   Problem: Pain Managment: Goal: General experience of comfort will improve Outcome: Progressing   Problem: Safety: Goal: Ability to remain free from injury will improve Outcome: Progressing   Problem: Skin Integrity: Goal: Risk for impaired skin integrity will decrease Outcome: Progressing   

## 2019-05-29 ENCOUNTER — Inpatient Hospital Stay (HOSPITAL_COMMUNITY): Payer: PRIVATE HEALTH INSURANCE | Admitting: Certified Registered"

## 2019-05-29 ENCOUNTER — Encounter (HOSPITAL_COMMUNITY)
Admission: EM | Disposition: A | Discharge status: Home / Self Care | Payer: Self-pay | Source: Home / Self Care | Attending: Orthopaedic Surgery

## 2019-05-29 ENCOUNTER — Inpatient Hospital Stay (HOSPITAL_COMMUNITY): Payer: PRIVATE HEALTH INSURANCE

## 2019-05-29 ENCOUNTER — Encounter (HOSPITAL_COMMUNITY): Payer: Self-pay | Admitting: *Deleted

## 2019-05-29 HISTORY — PX: REVERSE SHOULDER ARTHROPLASTY: SHX5054

## 2019-05-29 SURGERY — ARTHROPLASTY, SHOULDER, TOTAL, REVERSE
Anesthesia: General | Site: Shoulder | Laterality: Left

## 2019-05-29 MED ORDER — LIDOCAINE 2% (20 MG/ML) 5 ML SYRINGE
INTRAMUSCULAR | Status: AC
  Filled 2019-05-29: qty 5

## 2019-05-29 MED ORDER — SUGAMMADEX SODIUM 200 MG/2ML IV SOLN
INTRAVENOUS | Status: DC | PRN
  Administered 2019-05-29: 200 mg via INTRAVENOUS

## 2019-05-29 MED ORDER — 0.9 % SODIUM CHLORIDE (POUR BTL) OPTIME
TOPICAL | Status: DC | PRN
  Administered 2019-05-29: 1000 mL

## 2019-05-29 MED ORDER — DEXAMETHASONE SODIUM PHOSPHATE 10 MG/ML IJ SOLN
INTRAMUSCULAR | Status: DC | PRN
  Administered 2019-05-29: 10 mg via INTRAVENOUS

## 2019-05-29 MED ORDER — OXYCODONE HCL 5 MG PO TABS: 10.0000 mg | ORAL_TABLET | ORAL | Status: DC | PRN

## 2019-05-29 MED ORDER — BUPIVACAINE-EPINEPHRINE (PF) 0.5% -1:200000 IJ SOLN
INTRAMUSCULAR | Status: DC | PRN
  Administered 2019-05-29: 15 mL via PERINEURAL

## 2019-05-29 MED ORDER — PHENYLEPHRINE 40 MCG/ML (10ML) SYRINGE FOR IV PUSH (FOR BLOOD PRESSURE SUPPORT)
PREFILLED_SYRINGE | INTRAVENOUS | Status: AC
  Filled 2019-05-29: qty 10

## 2019-05-29 MED ORDER — PROPOFOL 10 MG/ML IV BOLUS
INTRAVENOUS | Status: DC | PRN
  Administered 2019-05-29: 120 mg via INTRAVENOUS

## 2019-05-29 MED ORDER — FENTANYL CITRATE (PF) 100 MCG/2ML IJ SOLN
50.0000 ug | INTRAMUSCULAR | Status: DC
  Administered 2019-05-29: 50 ug via INTRAVENOUS
  Filled 2019-05-29: qty 2

## 2019-05-29 MED ORDER — EPHEDRINE 5 MG/ML INJ
INTRAVENOUS | Status: AC
  Filled 2019-05-29: qty 10

## 2019-05-29 MED ORDER — MIDAZOLAM HCL 2 MG/2ML IJ SOLN
INTRAMUSCULAR | Status: AC
  Filled 2019-05-29: qty 2

## 2019-05-29 MED ORDER — SODIUM CHLORIDE 0.9 % IR SOLN
Status: DC | PRN
  Administered 2019-05-29: 1000 mL

## 2019-05-29 MED ORDER — VANCOMYCIN HCL 1 G IV SOLR
INTRAVENOUS | Status: DC | PRN
  Administered 2019-05-29: 1000 mg via TOPICAL

## 2019-05-29 MED ORDER — ALBUTEROL SULFATE HFA 108 (90 BASE) MCG/ACT IN AERS
INHALATION_SPRAY | RESPIRATORY_TRACT | Status: AC
  Filled 2019-05-29: qty 6.7

## 2019-05-29 MED ORDER — BUPIVACAINE LIPOSOME 1.3 % IJ SUSP
INTRAMUSCULAR | Status: DC | PRN
  Administered 2019-05-29: 10 mL via PERINEURAL

## 2019-05-29 MED ORDER — PHENYLEPHRINE HCL-NACL 10-0.9 MG/250ML-% IV SOLN
INTRAVENOUS | Status: DC | PRN
  Administered 2019-05-29: 40 ug/min via INTRAVENOUS

## 2019-05-29 MED ORDER — STERILE WATER FOR IRRIGATION IR SOLN
Status: DC | PRN
  Administered 2019-05-29: 2000 mL

## 2019-05-29 MED ORDER — MELOXICAM 7.5 MG PO TABS: 7.5000 mg | ORAL_TABLET | Freq: Every day | ORAL | 2 refills | Status: DC

## 2019-05-29 MED ORDER — ACETAMINOPHEN 500 MG PO TABS: 1000.0000 mg | ORAL_TABLET | Freq: Three times a day (TID) | ORAL | 0 refills | Status: AC

## 2019-05-29 MED ORDER — OXYCODONE HCL 5 MG PO TABS: ORAL_TABLET | ORAL | 0 refills | Status: AC

## 2019-05-29 MED ORDER — ONDANSETRON HCL 4 MG/2ML IJ SOLN: 4.0000 mg | Freq: Once | INTRAMUSCULAR | Status: DC | PRN

## 2019-05-29 MED ORDER — DEXAMETHASONE SODIUM PHOSPHATE 10 MG/ML IJ SOLN
INTRAMUSCULAR | Status: AC
  Filled 2019-05-29: qty 1

## 2019-05-29 MED ORDER — ONDANSETRON HCL 4 MG/2ML IJ SOLN
INTRAMUSCULAR | Status: AC
  Filled 2019-05-29: qty 2

## 2019-05-29 MED ORDER — ROCURONIUM BROMIDE 10 MG/ML (PF) SYRINGE
PREFILLED_SYRINGE | INTRAVENOUS | Status: DC | PRN
  Administered 2019-05-29: 40 mg via INTRAVENOUS
  Administered 2019-05-29: 20 mg via INTRAVENOUS

## 2019-05-29 MED ORDER — FENTANYL CITRATE (PF) 100 MCG/2ML IJ SOLN
INTRAMUSCULAR | Status: AC
  Filled 2019-05-29: qty 2

## 2019-05-29 MED ORDER — LIDOCAINE 2% (20 MG/ML) 5 ML SYRINGE
INTRAMUSCULAR | Status: DC | PRN
  Administered 2019-05-29: 20 mg via INTRAVENOUS

## 2019-05-29 MED ORDER — OXYCODONE HCL 5 MG PO TABS: 5.0000 mg | ORAL_TABLET | ORAL | Status: DC | PRN

## 2019-05-29 MED ORDER — ONDANSETRON HCL 4 MG PO TABS: 4.0000 mg | ORAL_TABLET | Freq: Three times a day (TID) | ORAL | 1 refills | Status: AC | PRN

## 2019-05-29 MED ORDER — CEFAZOLIN SODIUM-DEXTROSE 1-4 GM/50ML-% IV SOLN
1.0000 g | Freq: Three times a day (TID) | INTRAVENOUS | Status: DC
  Administered 2019-05-29: 1 g via INTRAVENOUS
  Filled 2019-05-29: qty 50

## 2019-05-29 MED ORDER — ONDANSETRON HCL 4 MG/2ML IJ SOLN
INTRAMUSCULAR | Status: DC | PRN
  Administered 2019-05-29: 4 mg via INTRAVENOUS

## 2019-05-29 MED ORDER — LACTATED RINGERS IV SOLN
INTRAVENOUS | Status: DC
  Administered 2019-05-29 (×2): via INTRAVENOUS

## 2019-05-29 MED ORDER — FENTANYL CITRATE (PF) 100 MCG/2ML IJ SOLN: 25.0000 ug | INTRAMUSCULAR | Status: DC | PRN

## 2019-05-29 MED ORDER — PHENYLEPHRINE HCL (PRESSORS) 10 MG/ML IV SOLN
INTRAVENOUS | Status: AC
  Filled 2019-05-29: qty 1

## 2019-05-29 MED ORDER — MIDAZOLAM HCL 2 MG/2ML IJ SOLN
1.0000 mg | INTRAMUSCULAR | Status: DC
  Administered 2019-05-29: 11:00:00 2 mg via INTRAVENOUS
  Filled 2019-05-29: qty 2

## 2019-05-29 MED ORDER — ROCURONIUM BROMIDE 10 MG/ML (PF) SYRINGE
PREFILLED_SYRINGE | INTRAVENOUS | Status: AC
  Filled 2019-05-29: qty 10

## 2019-05-29 MED ORDER — PROPOFOL 10 MG/ML IV BOLUS
INTRAVENOUS | Status: AC
  Filled 2019-05-29: qty 20

## 2019-05-29 MED ORDER — VANCOMYCIN HCL 1000 MG IV SOLR
INTRAVENOUS | Status: AC
  Filled 2019-05-29: qty 1000

## 2019-05-29 SURGICAL SUPPLY — 79 items
AID PSTN UNV HD RSTRNT DISP (MISCELLANEOUS) ×1
APL PRP STRL LF DISP 70% ISPRP (MISCELLANEOUS) ×2
BASEPLATE GLENOSPHERE 25 STD (Miscellaneous) ×1 IMPLANT
BASEPLATE GLENOSPHERE 25MM STD (Miscellaneous) ×1 IMPLANT
BIT DRILL 3.2 PERIPHERAL SCREW (BIT) ×2 IMPLANT
BLADE EXTENDED COATED 6.5IN (ELECTRODE) IMPLANT
BLADE SAW SAG 73X25 THK (BLADE) ×2
BLADE SAW SGTL 73X25 THK (BLADE) ×1 IMPLANT
BONE SCREW THREAD 6.5X35MM (Screw) ×1 IMPLANT
BSPLAT GLND STD 25 RVRS SHLDR (Miscellaneous) ×1 IMPLANT
CHLORAPREP W/TINT 26 (MISCELLANEOUS) ×6 IMPLANT
CLOSURE STERI-STRIP 1/2X4 (GAUZE/BANDAGES/DRESSINGS) ×1
CLOSURE WOUND 1/2 X4 (GAUZE/BANDAGES/DRESSINGS) ×1
CLSR STERI-STRIP ANTIMIC 1/2X4 (GAUZE/BANDAGES/DRESSINGS) ×2 IMPLANT
COOLER ICEMAN CLASSIC (MISCELLANEOUS) IMPLANT
COVER BACK TABLE 60X90IN (DRAPES) IMPLANT
COVER SURGICAL LIGHT HANDLE (MISCELLANEOUS) ×3 IMPLANT
COVER WAND RF STERILE (DRAPES) ×3 IMPLANT
DRAPE INCISE IOBAN 66X45 STRL (DRAPES) ×3 IMPLANT
DRAPE ORTHO SPLIT 77X108 STRL (DRAPES) ×6
DRAPE SHEET LG 3/4 BI-LAMINATE (DRAPES) ×3 IMPLANT
DRAPE SURG ORHT 6 SPLT 77X108 (DRAPES) ×2 IMPLANT
DRSG AQUACEL AG ADV 3.5X 6 (GAUZE/BANDAGES/DRESSINGS) ×3 IMPLANT
ELECT BLADE TIP CTD 4 INCH (ELECTRODE) ×3 IMPLANT
ELECT REM PT RETURN 15FT ADLT (MISCELLANEOUS) ×3 IMPLANT
GLENOSPHERE REV SHOULDER 36 (Joint) ×2 IMPLANT
GLOVE BIO SURGEON STRL SZ 6.5 (GLOVE) ×4 IMPLANT
GLOVE BIO SURGEONS STRL SZ 6.5 (GLOVE) ×2
GLOVE BIOGEL PI IND STRL 6.5 (GLOVE) ×1 IMPLANT
GLOVE BIOGEL PI IND STRL 8 (GLOVE) ×2 IMPLANT
GLOVE BIOGEL PI INDICATOR 6.5 (GLOVE) ×2
GLOVE BIOGEL PI INDICATOR 8 (GLOVE) ×4
GLOVE ECLIPSE 8.0 STRL XLNG CF (GLOVE) ×6 IMPLANT
GOWN STRL REUS W/ TWL XL LVL3 (GOWN DISPOSABLE) ×1 IMPLANT
GOWN STRL REUS W/TWL LRG LVL3 (GOWN DISPOSABLE) ×3 IMPLANT
GOWN STRL REUS W/TWL XL LVL3 (GOWN DISPOSABLE) ×3
GUIDEWIRE GLENOID 2.5X220 (WIRE) ×2 IMPLANT
HANDPIECE INTERPULSE COAX TIP (DISPOSABLE) ×3
HEMOSTAT SURGICEL 2X14 (HEMOSTASIS) IMPLANT
IMPL REVERSE SHOULDER 0X3.5 (Shoulder) IMPLANT
IMPLANT REVERSE SHOULDER 0X3.5 (Shoulder) ×3 IMPLANT
INSERT HUM REVERSE SHOULDER 36 (Miscellaneous) ×2 IMPLANT
KIT BASIN OR (CUSTOM PROCEDURE TRAY) ×3 IMPLANT
KIT STABILIZATION SHOULDER (MISCELLANEOUS) ×3 IMPLANT
KIT TURNOVER KIT A (KITS) IMPLANT
MANIFOLD NEPTUNE II (INSTRUMENTS) ×3 IMPLANT
NDL HYPO 25X1 1.5 SAFETY (NEEDLE) IMPLANT
NDL MAYO CATGUT SZ4 TPR NDL (NEEDLE) IMPLANT
NEEDLE HYPO 25X1 1.5 SAFETY (NEEDLE) IMPLANT
NEEDLE MAYO CATGUT SZ4 (NEEDLE) IMPLANT
NS IRRIG 1000ML POUR BTL (IV SOLUTION) ×3 IMPLANT
PACK SHOULDER (CUSTOM PROCEDURE TRAY) ×3 IMPLANT
PAD COLD SHLDR WRAP-ON (PAD) IMPLANT
PENCIL SMOKE EVACUATOR (MISCELLANEOUS) IMPLANT
RESTRAINT HEAD UNIVERSAL NS (MISCELLANEOUS) ×3 IMPLANT
SCREW 5.5X22 (Screw) ×4 IMPLANT
SCREW BONE THREAD 6.5X35 (Screw) ×1 IMPLANT
SCREW PERIPHERAL 30 (Screw) ×2 IMPLANT
SET HNDPC FAN SPRY TIP SCT (DISPOSABLE) ×1 IMPLANT
SLING ULTRA III MED (ORTHOPEDIC SUPPLIES) ×3 IMPLANT
SPONGE LAP 18X18 X RAY DECT (DISPOSABLE) IMPLANT
STEM HUMERAL 3B LONG 98 (Stem) IMPLANT
STEM HUMERAL SZ 3B LONG 98MM (Stem) ×3 IMPLANT
STRIP CLOSURE SKIN 1/2X4 (GAUZE/BANDAGES/DRESSINGS) ×1 IMPLANT
SUCTION FRAZIER HANDLE 12FR (TUBING) ×2
SUCTION TUBE FRAZIER 12FR DISP (TUBING) ×1 IMPLANT
SUT ETHIBOND 2 V 37 (SUTURE) ×3 IMPLANT
SUT ETHIBOND NAB CT1 #1 30IN (SUTURE) ×3 IMPLANT
SUT FIBERWIRE #5 38 CONV NDL (SUTURE) ×12
SUT FORCE FIBER 2 HI STR (SUTURE) ×8 IMPLANT
SUT FORCE FIBER SZ 5 SHOULD (Miscellaneous) ×4 IMPLANT
SUT FORCE FIBER SZ5 WHT/BLU (Miscellaneous) ×4 IMPLANT
SUT MNCRL AB 4-0 PS2 18 (SUTURE) ×3 IMPLANT
SUT VIC AB 0 CT1 36 (SUTURE) ×3 IMPLANT
SUT VIC AB 3-0 CT1 27 (SUTURE) ×3
SUT VIC AB 3-0 CT1 TAPERPNT 27 (SUTURE) IMPLANT
SUTURE FIBERWR #5 38 CONV NDL (SUTURE) ×4 IMPLANT
TOWEL OR 17X26 10 PK STRL BLUE (TOWEL DISPOSABLE) ×3 IMPLANT
WATER STERILE IRR 1000ML POUR (IV SOLUTION) ×6 IMPLANT

## 2019-05-29 NOTE — Anesthesia Procedure Notes (Signed)
Anesthesia Regional Block: Interscalene brachial plexus block   Pre-Anesthetic Checklist: ,, timeout performed, Correct Patient, Correct Site, Correct Laterality, Correct Procedure, Correct Position, site marked, Risks and benefits discussed,  Surgical consent,  Pre-op evaluation,  At surgeon's request and post-op pain management  Laterality: Left  Prep: chloraprep       Needles:  Injection technique: Single-shot  Needle Type: Echogenic Stimulator Needle     Needle Length: 9cm  Needle Gauge: 21     Additional Needles:   Procedures:, nerve stimulator,,, ultrasound used (permanent image in chart),,,,  Narrative:  Start time: 05/29/2019 10:42 AM End time: 05/29/2019 10:49 AM Injection made incrementally with aspirations every 5 mL.  Performed by: Personally  Anesthesiologist: Suzette Battiest, MD

## 2019-05-29 NOTE — Plan of Care (Signed)
Discharge instructions given to patient and husband.

## 2019-05-29 NOTE — Anesthesia Preprocedure Evaluation (Signed)
Anesthesia Evaluation  Patient identified by MRN, date of birth, ID band Patient awake    Reviewed: Allergy & Precautions, NPO status , Patient's Chart, lab work & pertinent test results  Airway Mallampati: II  TM Distance: >3 FB Neck ROM: Full    Dental  (+) Dental Advisory Given   Pulmonary neg pulmonary ROS,    breath sounds clear to auscultation       Cardiovascular hypertension, Pt. on medications  Rhythm:Regular Rate:Normal     Neuro/Psych negative neurological ROS     GI/Hepatic negative GI ROS, Neg liver ROS,   Endo/Other  negative endocrine ROS  Renal/GU negative Renal ROS     Musculoskeletal   Abdominal   Peds  Hematology negative hematology ROS (+)   Anesthesia Other Findings   Reproductive/Obstetrics                             Lab Results  Component Value Date   WBC 8.4 05/27/2019   HGB 13.9 05/27/2019   HCT 40.9 05/27/2019   MCV 95.8 05/27/2019   PLT 242 05/27/2019   Lab Results  Component Value Date   CREATININE 0.67 05/27/2019   BUN 11 05/27/2019   NA 142 05/27/2019   K 3.9 05/27/2019   CL 106 05/27/2019   CO2 24 05/27/2019    Anesthesia Physical Anesthesia Plan  ASA: II  Anesthesia Plan: General   Post-op Pain Management:  Regional for Post-op pain   Induction: Intravenous  PONV Risk Score and Plan: 3 and Dexamethasone, Ondansetron, Treatment may vary due to age or medical condition and Midazolam  Airway Management Planned: Oral ETT  Additional Equipment:   Intra-op Plan:   Post-operative Plan: Extubation in OR  Informed Consent: I have reviewed the patients History and Physical, chart, labs and discussed the procedure including the risks, benefits and alternatives for the proposed anesthesia with the patient or authorized representative who has indicated his/her understanding and acceptance.     Dental advisory given  Plan Discussed with:  CRNA  Anesthesia Plan Comments:         Anesthesia Quick Evaluation

## 2019-05-29 NOTE — Anesthesia Postprocedure Evaluation (Signed)
Anesthesia Post Note  Patient: Susan Snow  Procedure(s) Performed: REVERSE SHOULDER ARTHROPLASTY (Left Shoulder)     Patient location during evaluation: PACU Anesthesia Type: General Level of consciousness: awake Pain management: pain level controlled Vital Signs Assessment: post-procedure vital signs reviewed and stable Respiratory status: spontaneous breathing Cardiovascular status: stable Postop Assessment: no apparent nausea or vomiting Anesthetic complications: no    Last Vitals:  Vitals:   05/29/19 1130 05/29/19 1349  BP: (!) 144/77 131/77  Pulse: 70 92  Resp: 15 15  Temp:  (!) 36.3 C  SpO2: 96% 100%    Last Pain:  Vitals:   05/29/19 1349  TempSrc:   PainSc: 0-No pain   Pain Goal:                   Huston Foley

## 2019-05-29 NOTE — Progress Notes (Signed)
Assisted Dr. Rob Fitzgerald with left, ultrasound guided, interscalene  block. Side rails up, monitors on throughout procedure. See vital signs in flow sheet. Tolerated Procedure well. 

## 2019-05-29 NOTE — Interval H&P Note (Signed)
Discussed risks and benefits at length. Patient understands based on her constellation of injuries and reverse social advising will be the most predictable option. She understands the risk of infection, axillary nerve injury and very prosthetic interest. All questions were answered. I'll work on getting her to the opera him today.

## 2019-05-29 NOTE — Transfer of Care (Signed)
Immediate Anesthesia Transfer of Care Note  Patient: Susan Snow  Procedure(s) Performed: REVERSE SHOULDER ARTHROPLASTY (Left Shoulder)  Patient Location: PACU  Anesthesia Type:GA combined with regional for post-op pain  Level of Consciousness: drowsy, patient cooperative and responds to stimulation  Airway & Oxygen Therapy: Patient Spontanous Breathing and Patient connected to face mask oxygen  Post-op Assessment: Report given to RN and Post -op Vital signs reviewed and stable  Post vital signs: Reviewed and stable  Last Vitals:  Vitals Value Taken Time  BP 131/77 05/29/19 1348  Temp    Pulse 98 05/29/19 1350  Resp 17 05/29/19 1350  SpO2 100 % 05/29/19 1350  Vitals shown include unvalidated device data.  Last Pain:  Vitals:   05/29/19 1115  TempSrc:   PainSc: 0-No pain         Complications: No apparent anesthesia complications

## 2019-05-29 NOTE — Anesthesia Procedure Notes (Signed)
Procedure Name: Intubation Date/Time: 05/29/2019 11:44 AM Performed by: Silas Sacramento, CRNA Pre-anesthesia Checklist: Patient identified, Emergency Drugs available, Suction available and Patient being monitored Patient Re-evaluated:Patient Re-evaluated prior to induction Oxygen Delivery Method: Circle system utilized Preoxygenation: Pre-oxygenation with 100% oxygen Induction Type: IV induction Ventilation: Mask ventilation without difficulty Laryngoscope Size: Mac and 4 Grade View: Grade I Tube type: Oral Tube size: 7.0 mm Number of attempts: 1 Airway Equipment and Method: Stylet Placement Confirmation: ETT inserted through vocal cords under direct vision,  positive ETCO2 and breath sounds checked- equal and bilateral Secured at: 23 cm Tube secured with: Tape Dental Injury: Teeth and Oropharynx as per pre-operative assessment

## 2019-05-30 NOTE — Op Note (Signed)
Orthopaedic Surgery Operative Note (CSN: [id number])  Susan Snow  11/25/1950 Date of Surgery: 05/29/2019   Diagnoses:  Left proximal humerus fracture with rotator cuff insufficiency and anterior 30% glenoid fracture  Procedure: Left reverse total Shoulder Arthroplasty   Operative Finding Successful completion of planned procedure.  Patient's proximal humerus had likely 30% of the articular surface involved in her Hill-Sachs lesion that would have had a high likelihood of engaging when measured by the on track off track system described in the literature.  Additionally her glenoid had lost almost 30% of its anterior inferior with secondary to fracture with delamination of the cartilage and multiple loose bodies.  The decision for reverse shoulder arthroplasty was ideal and confirmed at this point.  Bone quality was very reasonable and we placed our baseplate a few millimeters higher than we typically would to avoid the affected bone.  This did allow great fixation overall and we were happy with our vault which was intact.  Good tension of the soft tissues in the subscap was found and protected throughout the case.  Post-operative plan: The patient will be NWB in sling.  The patient will be admitted overnight.  DVT prophylaxis not indicated in isolated upper extremity surgery patient with no specific risks factors.  Pain control with PRN pain medication preferring oral medicines.  Follow up plan will be scheduled in approximately 7 days for incision check and XR.  Physical therapy to startat 4 weeks.  Implants: Tornier size 3 longstem flex, 0 high offset tray with a 9 polyethylene and 36 glenosphere with 25 mm baseplate.  35 x 6.5 mm center screw with 3 peripheral screws  Post-Op Diagnosis: Same Surgeons:Primary: Bjorn PippinVarkey, Adler Alton T, MD Assistants:Caroline McBane PA-C [facility] Anesthesia: General with Exparel Interscalene Antibiotics: Ancef 2g preop, Vancomycin 1000mg   locally Tourniquet time: None Estimated Blood Loss: 100 Complications: None Specimens: None Implants: Implant Name Type Inv. Item Serial No. Manufacturer Lot No. LRB No. Used Action  BASEPLATE GLENOSPHERE 25MM STD - [id number] Miscellaneous BASEPLATE GLENOSPHERE 25MM STD [id number] [employer]  Left 1 Implanted  BONE SCREW THREAD 6.5X35MM - [id number] Screw BONE SCREW THREAD 6.5X35MM  [employer]  Left 1 Implanted  SCREW PERIPHERAL 30 - [id number] Screw SCREW PERIPHERAL 30  [employer]  Left 1 Implanted  SCREW 5.5X22 - [id number] Screw SCREW 5.5X22  [employer]  Left 2 Implanted  GLENOSPHERE REV SHOULDER 36 - [id number] Joint GLENOSPHERE REV SHOULDER 36 [id number] [employer]  Left 1 Implanted  INSERT HUM REVERSE SHOULDER 36 - [id number] Miscellaneous INSERT HUM REVERSE SHOULDER 36 [id number] [employer]  Left 1 Implanted  IMPLANT REVERSE SHOULDER 0X3.5 - [id number] Shoulder IMPLANT REVERSE SHOULDER 0X3.5  [employer] [id number] Left 1 Implanted  STEM HUMERAL SZ 3B LONG 98MM - [id number] Stem STEM HUMERAL SZ 3B LONG 98MM  [employer] [id number] Left 1 Implanted    Indications for Surgery:   Susan Snow is a 68 y.o. female with fall resulting in a fracture dislocation of the left shoulder for which I was consulted by one of my partners due to the complexity of the fracture.  Combination of CT as well as MRI was used to demonstrate the patient had cuff insufficiency posteriorly as well as a large Hill-Sachs lesion and significant glenoid bone loss anteriorly.  Based on all this we felt that and the patient was 68 years old a reverse total shoulder arthroplasty would provide her the most predictable results.  Benefits and risks of operative and nonoperative  management were discussed prior to surgery with patient/guardian(s) and informed consent form was completed.  Infection and need for further surgery were discussed as was prosthetic stability and cuff issues.  We additionally specifically  discussed risks of axillary nerve injury, infection, periprosthetic fracture, continued pain and longevity of implants prior to beginning procedure.      Procedure:   The patient was identified in the preoperative holding area where the surgical site was marked. Block placed by anesthesia with exparel.  The patient was taken to the OR where a procedural timeout was called and the above noted anesthesia was induced.  The patient was positioned beachchair on allen table with spider arm positioner.  Preoperative antibiotics were dosed.  The patient's left shoulder was prepped and draped in the usual sterile fashion.  A second preoperative timeout was called.       Standard deltopectoral approach was performed with a #10 blade. We dissected down to the subcutaneous tissues and the cephalic vein was taken laterally with the deltoid. Clavipectoral fascia was incised in line with the incision. Deep retractors were placed. The long of the biceps tendon was identified and there was significant tenosynovitis present.  Tenodesis was performed to the pectoralis tendon with #2 Ethibond. The remaining biceps was followed up into the rotator interval where it was released.   The subscapularis was taken down in a full thickness layer with capsule along the humeral neck extending inferiorly around the humeral head. We continued releasing the capsule directly off of the osteophytes inferiorly all the way around the corner. This allowed [place] to dislocate the humeral head.   Humeral head had 30% bone loss in the posterior aspect and clearly would have engaged on the remnant of the glenoid.  There was far posterior cuff tearing involving the infraspinatus primarily secondary to the fracture.  There was some involvement of the lesser tuberosity as well though this was nondisplaced.  We felt that reverse shoulder arthroplasty was indeed necessary but long stemmed implants would be reasonable to get good fixation  A humeral  cutting guide was inserted down the intramedullary canal. The version was set at 20 of retroversion. Humeral osteotomy was performed with an oscillating saw. The head fragment was passed off the back table. A starter awl was used to open the humeral canal. We next used T-handle straight sound reamers to ream up to an appropriate fit. A chisel was used to remove proximal humeral bone. We then broached starting with a size one broach and broaching up to 3 long which obtained an appropriate fit. The broach handle was removed. A cut protector was placed. The broach handle was removed and a cut protector was placed. The humerus was retracted posteriorly and we turned our attention to glenoid exposure.  The subscapularis was again identified and immediately we took care to palpate the axillary nerve anteriorly and verify its position with gentle palpation as well as the tug test.  We then released the SGHL with bovie cautery prior to placing a curved mayo at the junction of the anterior glenoid well above the axillary nerve and bluntly dissecting the subscapularis from the capsule.  We then carefully protected the axillary nerve as we gently released the inferior capsule to fully mobilize the subscapularis.  An anterior deltoid retractor was then placed as well as a small Hohmann retractor superiorly.   Glenoid was inspected and noted to have 30% anterior bone loss consistent with her injury.  There was delamination of the cartilage over  the inferior half of the glenoid and she would have had a very difficult time with nonoperative management considering this.  We felt that reverse was again confirmed based on this.  We carefully cleared remnant bone fragments and visualize the anterior inferior and posterior aspects of the glenoid clearly after a labral removal was performed.  The glenoid drill guide was placed and used to drill a guide pin in a slightly more centered mid glenoid position but we used trial  implants to note that our glenosphere would still cover the inferior border of the glenoid easily due to the patient's small glenoid. The glenoid face was then reamed concentrically over the guide wire. The center hole was drilled over the guidepin in a near anatomic angle of version. Next the  glenoid vault was drilled back to a depth of 35 mm.  We tapped and then placed a 42mm size baseplate with 109mm lateralization was selected with a 35 mm x 6.5 mm length central screw.  The base plate was screwed into the glenoid vault obtaining secure fixation. We next placed superior and inferior locking screws for additional fixation.  Next a 36 mm glenosphere was selected and impacted onto the baseplate. The center screw was tightened.  We turned attention back to the humeral side. The cut protector was removed. We trialed with multiple size tray and polyethylene options and selected a 9 which provided good stability and range of motion without excess soft tissue tension. The offset was dialed in to match the normal anatomy. The shoulder was trialed.  There was good ROM in all planes and the shoulder was stable with no inferior translation.  The real humeral implants were opened after again confirming sizes.  The trial was removed. #5 Fiberwire x4 sutures passed through the humeral neck for subscap repair. The humeral component was press-fit obtaining a secure fit. A +0 high offset tray was selected and impacted onto the stem.  A 36+9 polyethylene liner was impacted onto the stem.  The joint was reduced and thoroughly irrigated with pulsatile lavage. Subscap was repaired back with #5 Fiberwire sutures through bone tunnels. Hemostasis was obtained. The deltopectoral interval was reapproximated with #1 Ethibond. The subcutaneous tissues were closed with 2-0 Vicryl and the skin was closed with running monocryl.    The wounds were cleaned and dried and an Aquacel dressing was placed. The drapes taken down. The arm was  placed into sling with abduction pillow. Patient was awakened, extubated, and transferred to the recovery room in stable condition. There were no intraoperative complications. The sponge, needle, and attention counts were  correct at the end of the case.        Alfonse Alpers, PA-C, present and scrubbed throughout the case, critical for completion in a timely fashion, and for retraction, instrumentation, closure.

## 2019-05-30 NOTE — Discharge Summary (Signed)
Patient ID: Susan Snow MRN: [id number] DOB/AGE: Jun 10, 1951 68 y.o.  Admit date: 05/26/2019 Discharge date: 05/29/2019 Admission Diagnoses: shoulder dislocation  Discharge Diagnoses:  Active Problems:   Shoulder dislocation Left proximal humerus fracture with rotator cuff insufficiency and anterior 30% glenoid fracture  Susan Smackamela L Sikora is a 68 y.o. female with a past medical history of hypertension and hyperlipidemia.   Procedures Performed: Left reverse total Shoulder Arthroplasty  Discharged Condition: good/stable  Hospital Course:  Patient was walking outside the store on 05/26/2019 and tripped over a tread strip causing her to fall forward. She caught herself on her outstretched left arm and had immediate pain to the left shoulder. She presented to the Urgent Care for evaluation. X-rays showed left shoulder dislocation. She was sent to ED. Shoulder was successfully reduced in the ED. Patient was placed in shoulder immobilizer and admitted to orthopedic service.  Combination of CT as well as MRI was used to demonstrate the patient had cuff insufficiency posteriorly as well as a large Hill-Sachs lesion and significant glenoid bone loss anteriorly.  Based on all this we felt that and the patient was [age] years old a reverse total shoulder arthroplasty would provide her the most predictable results. Reverse total shoulder arthroplasty was performed on 05/29/2019. Patient tolerated procedure well.  She was kept for the day to receive one round of post-op antibiotics, medical monitoring, and pain control. She was found stable that evening and was discharged home. Patient was instructed on specific activity restrictions and all questions were answered.  Consults: None  Significant Diagnostic Studies: No additional pertinent studies  Treatments:  - Reduction of anterior shoulder dislocation in ED - Left reverse total shoulder arthroplasty on 12/2  Discharge Exam: Dressing CDI and  sling well fitting,  full and painless ROM throughout hand with DPC of 0.  Axillary nerve sensation/motor altered in setting of block and unable to be fully tested.  Distal motor and sensory altered in setting of block.   Disposition: Home Follow-up:  1 week in office  Medications: Sent electronically to patient's local pharmacy.   Discharge Instructions    Call MD for:  redness, tenderness, or signs of infection (pain, swelling, redness, odor or green/yellow discharge around incision site)   Complete by: As directed    Call MD for:  severe uncontrolled pain   Complete by: As directed    Call MD for:  temperature >100.4   Complete by: As directed    Discharge instructions   Complete by: As directed    Ramond Marrowax Varkey MD, MPH Alfonse Alpersaroline Saahas Hidrogo, PA-C [facility] [address] [phone] (tel)   [phone] (fax)   POST-OPERATIVE INSTRUCTIONS - TOTAL SHOULDER REPLACEMENT    WOUND CARE You may leave the operative dressing in place until your follow-up appointment. KEEP THE INCISIONS CLEAN AND DRY. There may be a small amount of fluid/bleeding leaking at the surgical site. This is normal after surgery.  If it fills with liquid or blood please call us immediately to change it for you. Use the provided ice machine or Ice packs as often as possible for the first 3-4 days, then as needed for pain relief.  Keep a layer of cloth or a shirt between your skin and the cooling unit to prevent frost bite as it can get very cold.  SHOWERING: - You may shower on Post-Op Day #2.  - The dressing is water resistant but do not scrub it as it may start to peel  up.   - You may remove the sling for showering, but keep a water resistant pillow under the arm to keep both the  elbow and shoulder away from the body (mimicking the abduction sling).  - Gently pat the area dry.  - Do not soak the shoulder in water. Do not go swimming in the pool or ocean until your sutures are  removed. - KEEP THE INCISIONS CLEAN AND DRY.  EXERCISES Wear the sling at all times except when doing your exercises. You may remove the sling for showering, but keep the arm across the chest or in a secondary sling.    Accidental/Purposeful External Rotation and shoulder flexion (reaching behind you) is to be avoided at all costs for the first month. It is ok to come out of your sling if your are sitting and have assistance for eating.  Do not lift anything heavier than 1 pound until we discuss it further in clinic. Please perform the exercises:   Elbow / Hand / Wrist  Range of Motion Exercises Grip strengthening   REGIONAL ANESTHESIA (NERVE BLOCKS) The anesthesia team may have performed a nerve block for you if safe in the setting of your care.  This is a great tool used to minimize pain.  Typically the block may start wearing off overnight but the long acting medicine may last for 3-4 days.  The nerve block wearing off can be a challenging period but please utilize your as needed pain medications to try and manage this period.    POST-OP MEDICATIONS- Multimodal approach to pain control In general your pain will be controlled with a combination of substances.  Prescriptions unless otherwise discussed are electronically sent to your pharmacy.  This is a carefully made plan we use to minimize narcotic use.    Meloxicam OR Celebrex - Anti-inflammatory medication taken on a scheduled basis Acetaminophen - Non-narcotic pain medicine taken on a scheduled basis  Oxycodone - This is a strong narcotic, to be used only on an "as needed" basis for pain. Zofran - take as needed for nausea  Meloxicam/Celebrex - these are anti-inflammatory and pain relievers.  Do not take additional ibuprofen, naproxen or other NSAID while taking this medicine.   FOLLOW-UP If you develop a Fever (>101.5), Redness or Drainage from the surgical incision site, please call our office to arrange for an evaluation. Please  call the office to schedule a follow-up appointment for a wound check, 7-10 days post-operatively.  IF YOU HAVE ANY QUESTIONS, PLEASE FEEL FREE TO CALL OUR OFFICE.   HELPFUL INFORMATION  If you had a block, it will wear off between 8-24 hrs postop typically.  This is period when your pain may go from nearly zero to the pain you would have had post-op without the block.  This is an abrupt transition but nothing dangerous is happening.  You may take an extra dose of narcotic when this happens.  Your arm will be in a sling following surgery. You will be in this sling for the next 3-4 weeks.  I will let you know the exact duration at your follow-up visit.  You may be more comfortable sleeping in a semi-seated position the first few nights following surgery.  Keep a pillow propped under the elbow and forearm for comfort.  If you have a recliner type of chair it might be beneficial.  If not that is fine too, but it would be helpful to sleep propped up with pillows behind your operated shoulder as well  under your elbow and forearm.  This will reduce pulling on the suture lines.  When dressing, put your operative arm in the sleeve first.  When getting undressed, take your operative arm out last.  Loose fitting, button-down shirts are recommended.  In most states it is against the law to drive while your arm is in a sling. And certainly against the law to drive while taking narcotics.  You may return to work/school in the next couple of days when you feel up to it. Desk work and typing in the sling is fine.  We suggest you use the pain medication the first night prior to going to bed, in order to ease any pain when the anesthesia wears off. You should avoid taking pain medications on an empty stomach as it will make you nauseous.  Do not drink alcoholic beverages or take illicit drugs when taking pain medications.  Pain medication may make you constipated.  Below are a few solutions to try in this  order: Decrease the amount of pain medication if you aren't having pain. Drink lots of decaffeinated fluids. Drink prune juice and/or each dried prunes  If the first 3 don't work start with additional solutions Take Colace - an over-the-counter stool softener Take Senokot - an over-the-counter laxative Take Miralax - a stronger over-the-counter laxative     Allergies as of 05/29/2019   No Known Allergies     Medication List    STOP taking these medications   naproxen sodium 220 MG tablet Commonly known as: ALEVE     TAKE these medications   acetaminophen 500 MG tablet Commonly known as: TYLENOL Take 2 tablets (1,000 mg total) by mouth every 8 (eight) hours for 14 days. What changed:   medication strength  how much to take  when to take this   Adult One Daily Gummies Chew Chew 2 tablets by mouth every morning.   PreserVision AREDS 2 Caps Take 1 capsule by mouth every other day.   aspirin EC 81 MG tablet Take 81 mg by mouth every morning.   fexofenadine 180 MG tablet Commonly known as: ALLEGRA Take 1 tablet (180 mg total) by mouth daily.   fluticasone 50 MCG/ACT nasal spray Commonly known as: FLONASE 1 spray to both nostrils twice daily until symptoms resolve   meloxicam 7.5 MG tablet Commonly known as: Mobic Take 1 tablet (7.5 mg total) by mouth daily.   olmesartan 20 MG tablet Commonly known as: BENICAR Take 20 mg by mouth every morning.   ondansetron 4 MG tablet Commonly known as: Zofran Take 1 tablet (4 mg total) by mouth every 8 (eight) hours as needed for up to 7 days for nausea or vomiting.   oxyCODONE 5 MG immediate release tablet Commonly known as: Oxy IR/ROXICODONE Take 1-2 pills every 6 hrs as needed for pain, no more than 6 per day   simvastatin 40 MG tablet Commonly known as: ZOCOR Take 40 mg by mouth at bedtime.   vitamin B-12 500 MCG tablet Commonly known as: CYANOCOBALAMIN Take 500 mcg by mouth every morning.      Follow-up  Information    [facility] EMERGENCY DEPARTMENT.   Specialty: Emergency Medicine Why: Return to ED with new or worsening symptoms or concerns Contact information: [address] [id number] mc [address] [place] [address] [phone]       Bjorn Pippin, MD In 1 week.   Specialty: Orthopedic Surgery Why: For wound re-check and x-rays Contact information: [address] [place] [address] [phone]

## 2019-05-30 NOTE — Progress Notes (Signed)
Patient discharged home with husband in stable condition.

## 2019-05-31 ENCOUNTER — Encounter (HOSPITAL_COMMUNITY): Payer: Self-pay | Admitting: Orthopaedic Surgery

## 2019-09-19 ENCOUNTER — Ambulatory Visit: Payer: PRIVATE HEALTH INSURANCE | Admitting: Family Medicine

## 2019-09-19 VITALS — BP 140/76 | HR 76 | Ht 64.0 in | Wt 191.0 lb

## 2019-09-19 DIAGNOSIS — Z789 Other specified health status: Secondary | ICD-10-CM

## 2019-09-19 NOTE — Progress Notes (Signed)
Subjective:     Patient ID: Susan Snow, female   DOB: [birth year], 69 y.o.   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 She has a PCP who she sees regularly for annual physical and lab work. She is due for mammogram and Colonoscopy due in a couple years. She recently had surgery on her left shoulder a few months ago d/t fall. She is doing well with her rehabilitation. She states she would like to lose weight. Is working on eating healthier.   shingrix - done in Aug 2019 covid-19 vaccine - completed in Feb  Past Medical History:  Diagnosis Date  . Hyperlipidemia   . Hypertension    No Known Allergies  Current Outpatient Medications:  .  aspirin EC 81 MG tablet, Take 81 mg by mouth every morning. , Disp: , Rfl:  .  Multiple Vitamins-Minerals (ADULT ONE DAILY GUMMIES) CHEW, Chew 2 tablets by mouth every morning., Disp: , Rfl:  .  olmesartan (BENICAR) 20 MG tablet, Take 20 mg by mouth every morning. , Disp: , Rfl: 3 .  simvastatin (ZOCOR) 40 MG tablet, Take 40 mg by mouth at bedtime. , Disp: , Rfl: 3 .  vitamin B-12 (CYANOCOBALAMIN) 500 MCG tablet, Take 500 mcg by mouth every morning. , Disp: , Rfl:  .  fexofenadine (ALLEGRA) 180 MG tablet, Take 1 tablet (180 mg total) by mouth daily. (Patient not taking: Reported on 05/26/2019), Disp: 30 tablet, Rfl: tab .  fluticasone (FLONASE) 50 MCG/ACT nasal spray, 1 spray to both nostrils twice daily until symptoms resolve (Patient not taking: Reported on 05/26/2019), Disp: 16 g, Rfl: 0 .  Multiple Vitamins-Minerals (PRESERVISION AREDS 2) CAPS, Take 1 capsule by mouth every other day., Disp: , Rfl:   Review of Systems  Constitutional: Negative for chills, fatigue, fever and unexpected weight change.  HENT: Negative for congestion, ear pain, sinus pressure, sinus pain and sore throat.   Eyes: Negative for discharge and visual disturbance.  Respiratory: Negative for  cough, shortness of breath and wheezing.   Cardiovascular: Negative for chest pain and leg swelling.  Gastrointestinal: Negative for abdominal pain, blood in stool, constipation, diarrhea, nausea and vomiting.  Genitourinary: Negative for difficulty urinating and hematuria.  Skin: Negative for color change.  Neurological: Negative for dizziness, weakness, light-headedness and headaches.  Hematological: Negative for adenopathy.  All other systems reviewed and are negative.      Objective:   Physical Exam Vitals reviewed.  Constitutional:      General: She is not in acute distress.    Appearance: Normal appearance. She is well-developed.  HENT:     Head: Normocephalic and atraumatic.  Eyes:     General:        Right eye: No discharge.        Left eye: No discharge.  Cardiovascular:     Rate and Rhythm: Normal rate and regular rhythm.     Heart sounds: Normal heart sounds.  Pulmonary:     Effort: Pulmonary effort is normal. No respiratory distress.     Breath sounds: Normal breath sounds.  Musculoskeletal:     Cervical back: Neck supple.  Skin:    General: Skin is warm and dry.  Neurological:     Mental Status: She is alert and oriented to person, place, and time.  Psychiatric:        Mood and Affect: Mood normal.  Behavior: Behavior normal.    Today's Vitals   09/19/19 1419  BP: 140/76  Pulse: 76  SpO2: 97%  Weight: 191 lb (86.6 kg)  Height: 5\' 4"  (1.626 m)   Body mass index is 32.79 kg/m.      Assessment:     Participant in health and wellness plan      Plan:     1. Keep all regular appts with PCP 2. Encouraged healthy eating and increased physical activity as tolerated for weight loss. 3. Will f/u regarding pna vaccine.  4. F/u prn.

## 2020-06-02 IMAGING — CT CT SHOULDER*L* W/O CM
3 of 4 series · 16 of 34 positions shown, 18 images · non-contrast
Comparison: Radiograph same day

CLINICAL DATA: Dislocation, fall, shoulder fracture

EXAM:
CT OF THE UPPER LEFT EXTREMITY WITHOUT CONTRAST
TECHNIQUE: Multidetector CT imaging of the upper left extremity was performed
according to the standard protocol.

[Series 4: (id) st ax · axial · 0.49mm/px · z∈[-732,-600]mm · 7 of 79 slices shown, 9 images]
[im 7/79  soft-tissue]
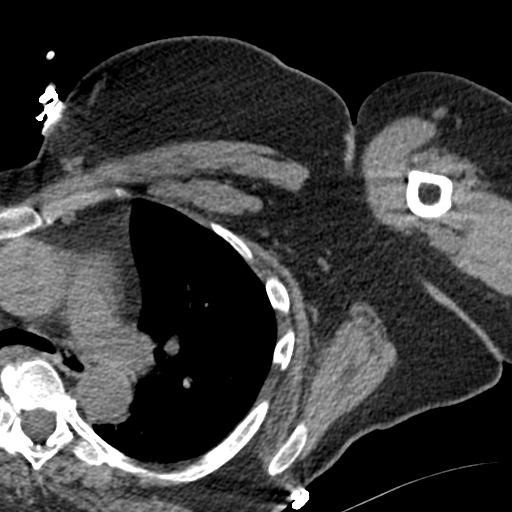
[im 7/79  bone]
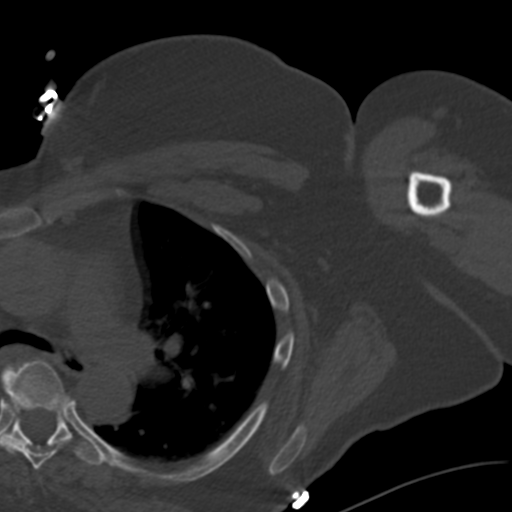
[im 19/79  bone]
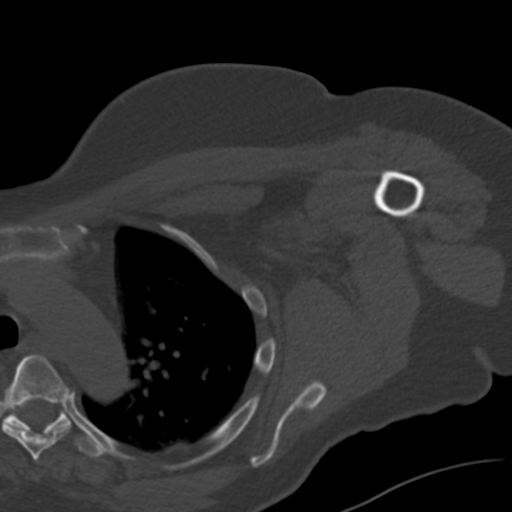
[im 31/79  bone]
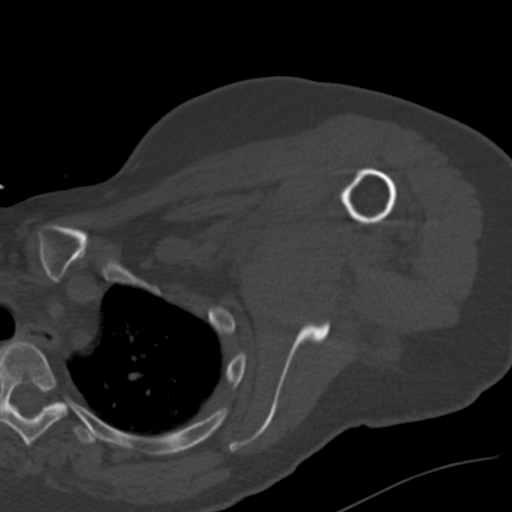
[im 43/79  bone]
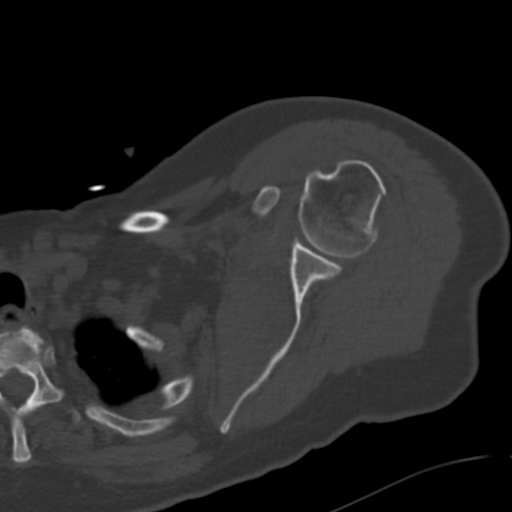
[im 49/79  soft-tissue]
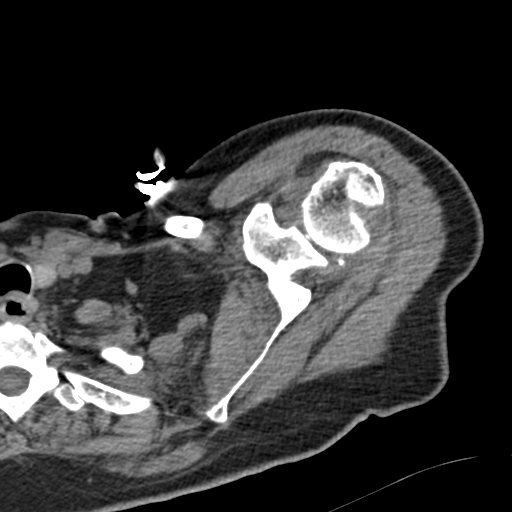
[im 49/79  bone]
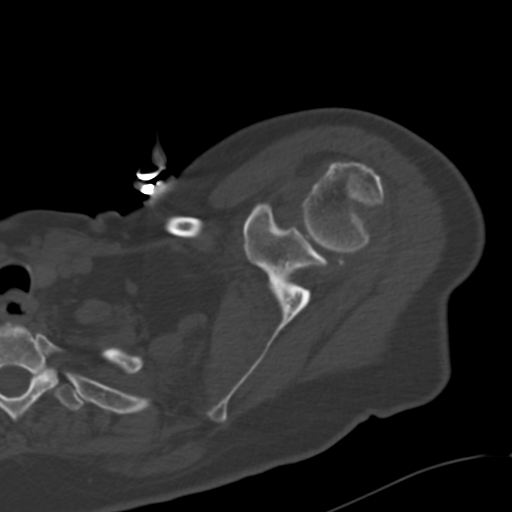
[im 61/79  bone]
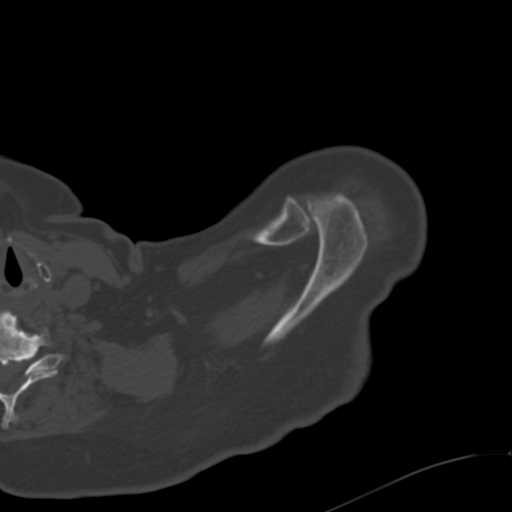
[im 73/79  bone]
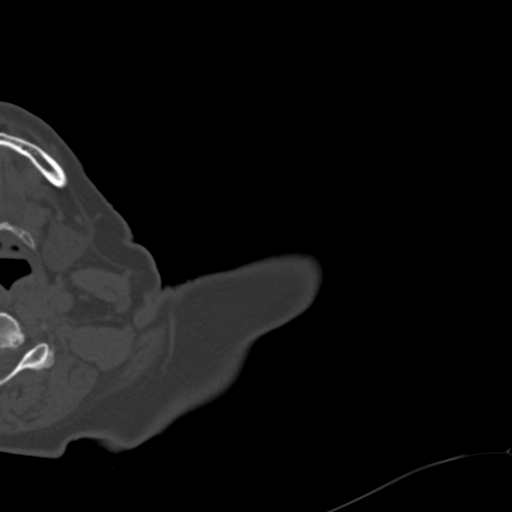

[Series 7: (id) st cor · coronal · 0.42mm/px · 3 of 101 slices shown]
[im 21/101  bone]
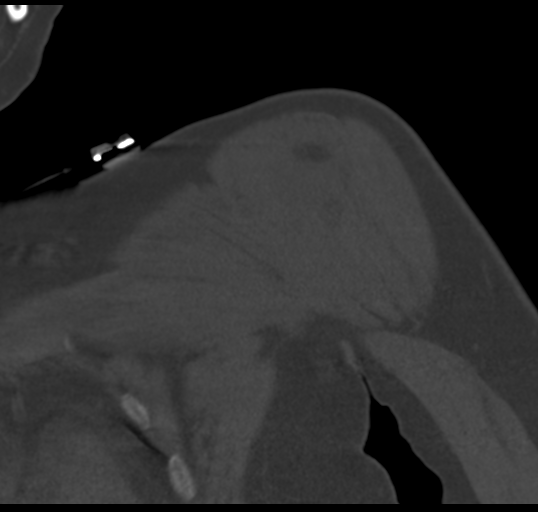
[im 41/101  bone]
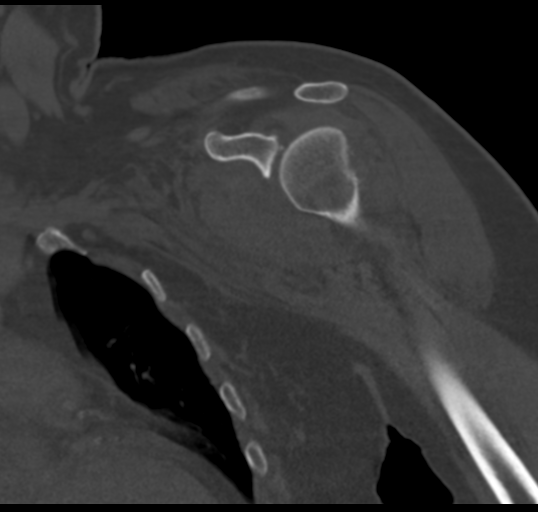
[im 61/101  bone]
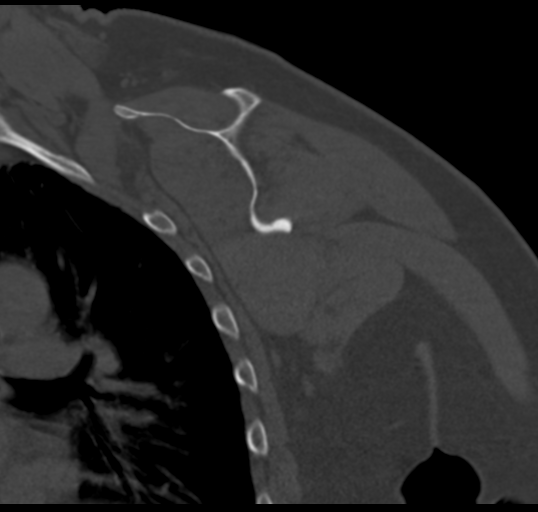

[Series 8: (id) st sag · sagittal · 0.38mm/px · 6 of 125 slices shown]
[im 21/125  bone]
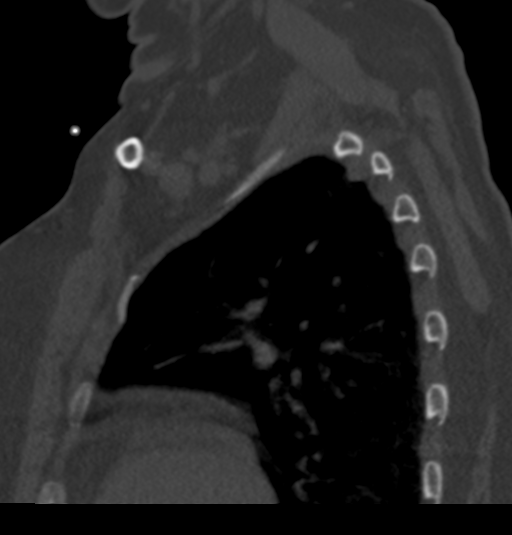
[im 42/125  bone]
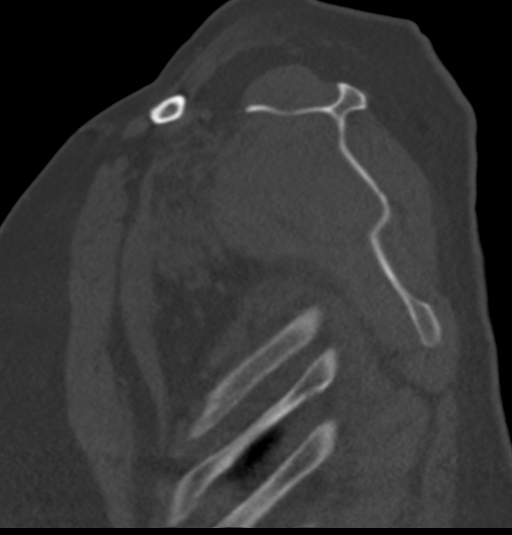
[im 52/125  soft-tissue]
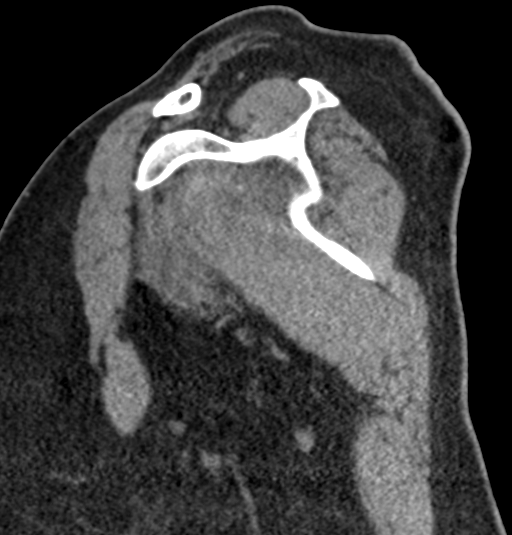
[im 63/125  bone]
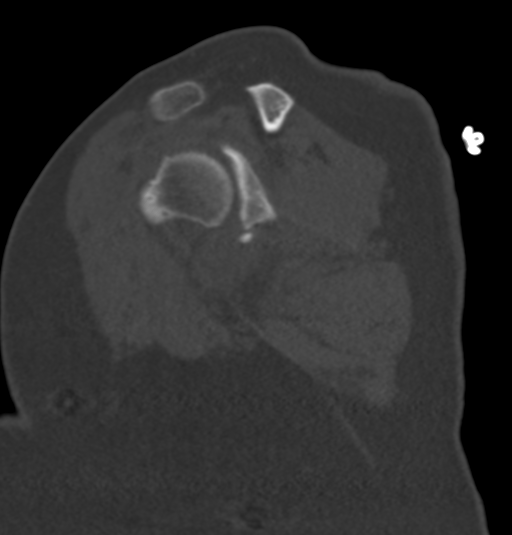
[im 83/125  bone]
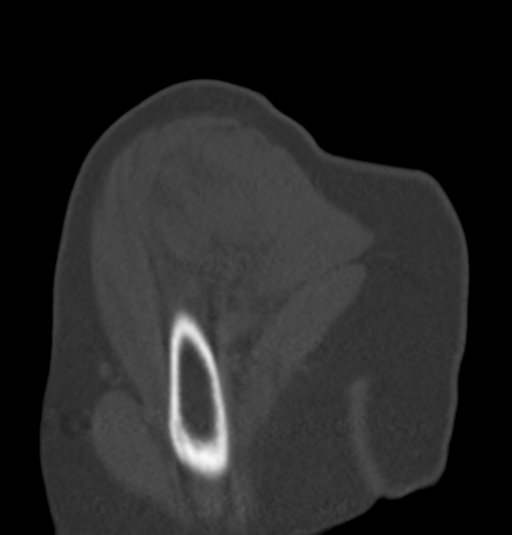
[im 104/125  bone]
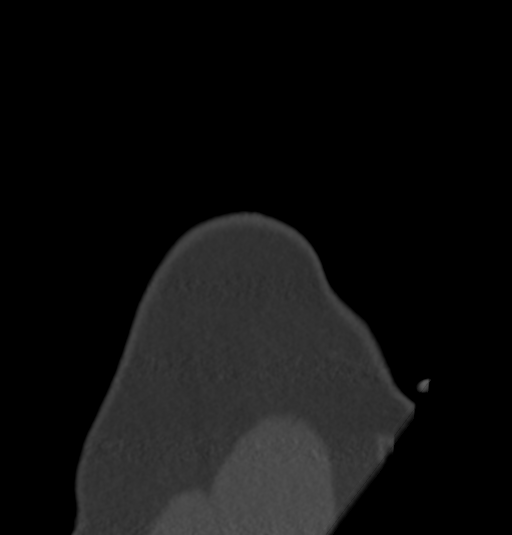

[16 of 34 positions shown; findings below may reference images not displayed]

FINDINGS: Bones/Joint/Cartilage

The patient is status post reduction of anterior dislocation. The
humeral head is well seated within the glenoid. There is a large
Hill-Sachs deformity with small osseous fragments seen posteriorly.
Tiny osseous fragments are seen within the superior glenohumeral
joint space the largest measuring 6 mm seen on series 3, image 30,
which could represent chip fractures from the posterior glenoid
surface or displaced fragments from the Hill-Sachs deformity. There
is a comminuted mildly displaced osseous Bankart lesion which
involve the posteroinferior glenoid surface as well. Osseous
fragment measuring 9 mm is seen in the posteroinferior glenohumeral
joint space. The AC joint is intact. No other fractures are
identified. A moderate glenohumeral joint effusion is seen.

Ligaments

Suboptimally assessed by CT.

Muscles and Tendons

There edema involving the deltoid musculature. Within the
subscapularis muscle belly there is a 2.9 cm fat containing lesion
which could represent intramuscular lipoma or focal fatty atrophy.
The tendons appear to be grossly intact, however suboptimally
visualized.

Soft tissues

There is soft tissue edema surrounding the anterior shoulder and
within the axilla.
IMPRESSION: 1. Status post reduction of anterior dislocation.
2. Comminuted impacted Hill-Sachs deformity with adjacent small
osseous fragments.
3. Tiny osseous fragments in the superior glenohumeral joint space
the largest measuring 6 mm which could represent chip fractures from
the posterior glenoid surface or displaced osseous fragment from the
Hill-Sachs deformity.
4. Comminuted mildly displaced osseous Bankart lesion that also of
involves the posterior inferior glenoid surface.
5. Adjacent osseous fragment measuring 9 mm seen in the
posteroinferior glenohumeral joint space.
6. 2.9 cm fat containing lesion within subscapularis muscle belly
which could represent intramuscular lipoma or focal fatty atrophy.

## 2021-01-28 ENCOUNTER — Encounter: Payer: Self-pay | Admitting: Family Medicine

## 2021-01-28 ENCOUNTER — Ambulatory Visit: Payer: PRIVATE HEALTH INSURANCE | Admitting: Family Medicine

## 2021-01-28 VITALS — BP 148/90 | HR 78 | Ht 64.0 in | Wt 187.0 lb

## 2021-01-28 DIAGNOSIS — I10 Essential (primary) hypertension: Secondary | ICD-10-CM

## 2021-01-28 DIAGNOSIS — Z789 Other specified health status: Secondary | ICD-10-CM

## 2021-01-28 NOTE — Progress Notes (Signed)
Subjective:     Patient ID: Susan Snow, female   DOB: [birth year], 70 y.o.   MRN: [id number]  HPI Susan Snow presents to the employee health and wellness clinic today for her required wellness visit for her insurance. She has a PCP she sees regularly. Her mammogram is UTD. She is due for her colonoscopy and will get that scheduled. She reports it has been over 5 years since her last pap smear, but was normal. She just received her second covid-19 booster dose last week. She denies any problems or concerns today. She is feeling well. She reports she does not exercise and is not very health conscious with the foods she eats but would like to do better with this. She is traveling soon to see family.   Past Medical History:  Diagnosis Date   Hyperlipidemia    Hypertension    No Known Allergies  Current Outpatient Medications:    aspirin EC 81 MG tablet, Take 81 mg by mouth every morning. , Disp: , Rfl:    Multiple Vitamins-Minerals (ADULT ONE DAILY GUMMIES) CHEW, Chew 2 tablets by mouth every morning., Disp: , Rfl:    olmesartan (BENICAR) 20 MG tablet, Take 20 mg by mouth every morning. , Disp: , Rfl: 3   simvastatin (ZOCOR) 40 MG tablet, Take 40 mg by mouth at bedtime. , Disp: , Rfl: 3   vitamin B-12 (CYANOCOBALAMIN) 500 MCG tablet, Take 500 mcg by mouth every morning. , Disp: , Rfl:     Review of Systems  Constitutional:  Negative for chills, fatigue, fever and unexpected weight change.  HENT:  Negative for congestion, ear pain, sinus pressure, sinus pain and sore throat.   Eyes:  Negative for discharge and visual disturbance.  Respiratory:  Negative for cough, shortness of breath and wheezing.   Cardiovascular:  Negative for chest pain and leg swelling.  Gastrointestinal:  Negative for abdominal pain, blood in stool, constipation, diarrhea, nausea and vomiting.  Genitourinary:  Negative for difficulty urinating and hematuria.  Skin:  Negative for color change.  Neurological:  Negative  for dizziness, weakness, light-headedness and headaches.  Hematological:  Negative for adenopathy.  All other systems reviewed and are negative.     Objective:   Physical Exam Vitals reviewed.  Constitutional:      General: She is not in acute distress.    Appearance: Normal appearance. She is well-developed.  HENT:     Head: Normocephalic and atraumatic.  Eyes:     General:        Right eye: No discharge.        Left eye: No discharge.  Cardiovascular:     Rate and Rhythm: Normal rate and regular rhythm.     Pulses: Normal pulses.     Heart sounds: Normal heart sounds.  Pulmonary:     Effort: Pulmonary effort is normal. No respiratory distress.     Breath sounds: Normal breath sounds.  Musculoskeletal:     Cervical back: Neck supple.     Right lower leg: No edema.     Left lower leg: No edema.  Skin:    General: Skin is warm and dry.     Capillary Refill: Capillary refill takes less than 2 seconds.  Neurological:     Mental Status: She is alert and oriented to person, place, and time.  Psychiatric:        Mood and Affect: Mood normal.        Behavior: Behavior normal.  Today's Vitals  01/28/21 0927  BP: (!) 148/90  Pulse: 78  SpO2: 98%  Weight: 187 lb (84.8 kg)  Height: 5\' 4"  (1.626 m)   Body mass index is 32.1 kg/m.      Assessment:     Participant in health and wellness plan  Primary hypertension     Plan:     Discussed need for scheduling colonoscopy. Keep all regular appts with PCP. Encouraged healthy lifestyle changes, increased physical activity and intake of fruits and vegetables.  BP elevated today. Check BP at home, if remains greater than 140/90 consistently should f/u with PCP and discuss change in management. Encouraged DASH diet and exercise to help improve BP control as well. F/u here prn.

## 2021-10-25 HISTORY — PX: COLONOSCOPY: SHX174

## 2021-11-25 ENCOUNTER — Encounter: Payer: Self-pay | Admitting: Nurse Practitioner

## 2021-11-25 ENCOUNTER — Ambulatory Visit: Payer: PRIVATE HEALTH INSURANCE | Admitting: Nurse Practitioner

## 2021-11-25 VITALS — BP 126/78 | HR 79 | Temp 97.2°F | Wt 186.8 lb

## 2021-11-25 DIAGNOSIS — Z789 Other specified health status: Secondary | ICD-10-CM

## 2021-11-25 NOTE — Progress Notes (Signed)
  Established Patient Office Visit  Subjective   Patient ID: Susan Snow, female    DOB: [birth year]  Age: 71 y.o. MRN: [id number]  Chief Complaint  Patient presents with   Wellness visit   Susan Snow is a 71 year old female who presents today for her annual wellness visit. 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 She is has history of HTN, currently controlled on olmesartan 20mg  daily.  Labs per PCP; last labs were completed March 2023.   Reports she completed Colonoscopy 2 weeks ago through [facility]. Mammogram/DEXA: has both scheduled to be completed next week.    Patient does not smoke  Immunizations: Shingrix x2 - completed in 2019 Pneumovax 23 completed in 2023 COVID x2- declines further booster   Past Medical History:  Diagnosis Date   Hyperlipidemia    Hypertension    Past Surgical History:  Procedure Laterality Date   BREAST EXCISIONAL BIOPSY Right 1978   REVERSE SHOULDER ARTHROPLASTY Left 05/29/2019   Procedure: REVERSE SHOULDER ARTHROPLASTY;  Surgeon: 14/07/2018, MD;  Location: [facility];  Service: Orthopedics;  Laterality: Left;   No Known Allergies   Review of Systems  Constitutional:  Negative for fever.  Cardiovascular: Negative.   Gastrointestinal: Negative.   Endo/Heme/Allergies:  Positive for environmental allergies.     Objective:    BP 126/78   Pulse 79   Temp (!) 97.2 F (36.2 C)   Wt 186 lb 12.8 oz (84.7 kg)   SpO2 94%   BMI 32.06 kg/m  BP Readings from Last 3 Encounters:  11/25/21 126/78  01/28/21 (!) 148/90  09/19/19 140/76    Physical Exam Constitutional:      General: She is not in acute distress. HENT:     Head: Normocephalic.  Cardiovascular:     Rate and Rhythm: Normal rate and regular rhythm.     Heart sounds: Normal heart sounds.  Pulmonary:     Effort: Pulmonary effort is normal.     Breath sounds: Normal breath sounds.  Musculoskeletal:        General: Normal range  of motion.  Skin:    General: Skin is warm.  Neurological:     General: No focal deficit present.     Mental Status: She is alert and oriented to person, place, and time.  Psychiatric:        Mood and Affect: Mood normal.        Behavior: Behavior normal.      Assessment & Plan:   Visit Diagnoses     Participant in health and wellness plan    -  Primary   Wellness physical was conducted today. Encouraged follow-up with PCP as recommended for chronic disease management.  Reviewed immunizations: patient is up to date with these.  Preventative health exams needed: pending mammogram and DEXA next week.  Encouraged yearly eye exam as well as dental exam/cleaning at least twice a year.  Patient was advised yearly wellness exam.     RTC to clinic as needed.   09/21/19, NP

## 2023-02-02 ENCOUNTER — Encounter: Payer: Self-pay | Admitting: Nurse Practitioner

## 2023-02-02 ENCOUNTER — Ambulatory Visit: Payer: PRIVATE HEALTH INSURANCE | Admitting: Nurse Practitioner

## 2023-02-02 VITALS — BP 128/79 | HR 78 | Ht 64.0 in | Wt 189.0 lb

## 2023-02-02 DIAGNOSIS — Z789 Other specified health status: Secondary | ICD-10-CM

## 2023-02-02 NOTE — Progress Notes (Signed)
[facility]- [facility]  Subjective:  Patient ID: Susan Snow, female    DOB: 07/07/1950  Age: 72 y.o. MRN: [id number]  CC: Wellness Exam  HPI Susan Snow presents for wellness exam visit for insurance benefit.  Patient has a PCP: Susan Snow  PMH significant for: HTN, pre-diabetes and HLD.  Last labs per PCP were completed: March 2024  Health Maintenance:  Colonoscopy: 2023, repeat in 10 years Mammogram: 12/14/2022 DEXA: 2023  Smoker: Never  Immunizations:  Shingrix-  completed Pneumovax - completed COVID- x 2 Flu- yearly  Lifestyle: Diet- cooks most meals at home. Exercise- does not exercise but stays active at work.    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      Calcium Carb-Cholecalciferol 600-5 MG-MCG TABS Take 1 tablet by mouth daily.     Multiple Vitamins-Minerals (ADULT ONE DAILY GUMMIES) CHEW Chew 2 tablets by mouth every morning.     olmesartan (BENICAR) 20 MG tablet Take 20 mg by mouth every morning.   3   simvastatin (ZOCOR) 40 MG tablet Take 40 mg by mouth at bedtime.   3   vitamin B-12 (CYANOCOBALAMIN) 500 MCG tablet Take 500 mcg by mouth every morning.      No facility-administered medications prior to visit.    ROS Review of Systems  Constitutional:  Negative for fatigue and unexpected weight change.  HENT:  Negative for hearing loss.   Eyes:  Negative for visual disturbance (uses glasses).  Respiratory:  Negative for shortness of breath.   Cardiovascular:  Negative for chest pain and leg  swelling.  Gastrointestinal:  Negative for constipation, diarrhea, nausea and vomiting.  Musculoskeletal:  Positive for back pain (ocassionally lower back).  Neurological:  Negative for dizziness and headaches.    Objective:  BP 128/79   Pulse 78   Ht 5\' 4"  (1.626 m)   Wt 189 lb (85.7 kg)   SpO2 99%   BMI 32.44 kg/m   Physical Exam Constitutional:      General: She is not in acute distress. HENT:     Head: Normocephalic.  Cardiovascular:     Rate and Rhythm: Normal rate and regular rhythm.     Pulses: Normal pulses.     Heart sounds: Normal heart sounds.  Pulmonary:     Effort: Pulmonary effort is normal. No respiratory distress.     Breath sounds: Normal breath sounds.  Abdominal:     General: Abdomen is flat.     Palpations: Abdomen is soft.  Musculoskeletal:        General: Normal range of motion.     Cervical back: Normal range of motion.  Skin:    General: Skin is warm.  Neurological:     General: No focal deficit present.     Mental Status: She is alert and oriented to person, place, and time.  Psychiatric:        Mood and Affect: Mood normal.        Behavior: Behavior normal.    Assessment & Plan:  [name] was seen today for wellness exam.  Diagnoses and all orders for this visit:  Participant in health and wellness plan   Adult wellness physical was conducted today. Importance of diet and exercise were discussed in detail.  We reviewed immunizations and gave recommendations regarding current immunization needed for age.  Preventative health exams are up to date.   Patient was advised yearly wellness exam and follow-up with PCP as recommended.  No orders of the defined types were placed in this encounter.   No orders of the defined types were placed in this encounter.   Follow-up: as needed

## 2023-12-26 DIAGNOSIS — R221 Localized swelling, mass and lump, neck: Secondary | ICD-10-CM

## 2023-12-26 HISTORY — DX: Localized swelling, mass and lump, neck: R22.1

## 2024-01-14 ENCOUNTER — Other Ambulatory Visit: Payer: Self-pay

## 2024-01-14 ENCOUNTER — Emergency Department (HOSPITAL_BASED_OUTPATIENT_CLINIC_OR_DEPARTMENT_OTHER)
Admission: EM | Admit: 2024-01-14 | Discharge: 2024-01-14 | Disposition: A | Discharge status: Home / Self Care | Payer: PRIVATE HEALTH INSURANCE | Attending: Emergency Medicine | Admitting: Emergency Medicine

## 2024-01-14 ENCOUNTER — Emergency Department (HOSPITAL_BASED_OUTPATIENT_CLINIC_OR_DEPARTMENT_OTHER): Payer: PRIVATE HEALTH INSURANCE

## 2024-01-14 ENCOUNTER — Encounter (HOSPITAL_BASED_OUTPATIENT_CLINIC_OR_DEPARTMENT_OTHER): Payer: Self-pay

## 2024-01-14 DIAGNOSIS — K1120 Sialoadenitis, unspecified: Secondary | ICD-10-CM | POA: Insufficient documentation

## 2024-01-14 DIAGNOSIS — D72829 Elevated white blood cell count, unspecified: Secondary | ICD-10-CM | POA: Insufficient documentation

## 2024-01-14 DIAGNOSIS — I10 Essential (primary) hypertension: Secondary | ICD-10-CM | POA: Diagnosis not present

## 2024-01-14 DIAGNOSIS — R220 Localized swelling, mass and lump, head: Secondary | ICD-10-CM | POA: Diagnosis present

## 2024-01-14 DIAGNOSIS — Z7982 Long term (current) use of aspirin: Secondary | ICD-10-CM | POA: Diagnosis not present

## 2024-01-14 LAB — CBC WITH DIFFERENTIAL/PLATELET
Abs Immature Granulocytes: 0.03 K/uL (ref 0.00–0.07)
Basophils Absolute: 0 K/uL (ref 0.0–0.1)
Basophils Relative: 0 %
Eosinophils Absolute: 0.2 K/uL (ref 0.0–0.5)
Eosinophils Relative: 1 %
HCT: 41 % (ref 36.0–46.0)
Hemoglobin: 14.2 g/dL (ref 12.0–15.0)
Immature Granulocytes: 0 %
Lymphocytes Relative: 24 %
Lymphs Abs: 2.8 K/uL (ref 0.7–4.0)
MCH: 33.7 pg (ref 26.0–34.0)
MCHC: 34.6 g/dL (ref 30.0–36.0)
MCV: 97.4 fL (ref 80.0–100.0)
Monocytes Absolute: 0.6 K/uL (ref 0.1–1.0)
Monocytes Relative: 5 %
Neutro Abs: 7.9 K/uL — ABNORMAL HIGH (ref 1.7–7.7)
Neutrophils Relative %: 70 %
Platelets: 323 K/uL (ref 150–400)
RBC: 4.21 MIL/uL (ref 3.87–5.11)
RDW: 11.4 % — ABNORMAL LOW (ref 11.5–15.5)
WBC: 11.5 K/uL — ABNORMAL HIGH (ref 4.0–10.5)
nRBC: 0 % (ref 0.0–0.2)

## 2024-01-14 LAB — COMPREHENSIVE METABOLIC PANEL WITH GFR
ALT: 29 U/L (ref 0–44)
AST: 28 U/L (ref 15–41)
Albumin: 4.6 g/dL (ref 3.5–5.0)
Alkaline Phosphatase: 94 U/L (ref 38–126)
Anion gap: 15 (ref 5–15)
BUN: 14 mg/dL (ref 8–23)
CO2: 23 mmol/L (ref 22–32)
Calcium: 9.6 mg/dL (ref 8.9–10.3)
Chloride: 102 mmol/L (ref 98–111)
Creatinine, Ser: 0.84 mg/dL (ref 0.44–1.00)
GFR, Estimated: >60 mL/min (ref >60)
Glucose, Bld: 111 mg/dL — ABNORMAL HIGH (ref 70–99)
Potassium: 4.1 mmol/L (ref 3.5–5.1)
Sodium: 139 mmol/L (ref 135–145)
Total Bilirubin: 1.3 mg/dL — ABNORMAL HIGH (ref 0.0–1.2)
Total Protein: 7.7 g/dL (ref 6.5–8.1)

## 2024-01-14 MED ORDER — AMOXICILLIN-POT CLAVULANATE 875-125 MG PO TABS: 1.0000 | ORAL_TABLET | Freq: Two times a day (BID) | ORAL | 0 refills | Status: AC

## 2024-01-14 MED ORDER — NAPROXEN 500 MG PO TABS
500.0000 mg | ORAL_TABLET | Freq: Two times a day (BID) | ORAL | 0 refills | Status: AC
Start: 2024-01-14 — End: 2024-01-21

## 2024-01-14 MED ORDER — IOHEXOL 350 MG/ML SOLN
100.0000 mL | Freq: Once | INTRAVENOUS | Status: AC | PRN
  Administered 2024-01-14: 75 mL via INTRAVENOUS

## 2024-01-14 NOTE — ED Triage Notes (Signed)
 Pt c/o swelling to R jawline/ lymph nodes this AM, really didn't start swelling until 12:30. States that ears have been weird- like allergies & such for a bit.  States she is unable to open her mouth all the way r/t swelling.

## 2024-01-14 NOTE — Discharge Instructions (Addendum)
 You were given a short course of antibiotics, please take 1 tablet today for the next 7 days.  In addition, you were given anti-inflammatories, please take 1 tablet twice a days for the next 7 days.  Discussed the results of your CT at length, you will need to have repeat imaging in 4 weeks in order to reevaluate your lymph nodes.

## 2024-01-14 NOTE — ED Provider Notes (Signed)
 [place] EMERGENCY DEPARTMENT AT [facility] Provider Note   CSN: [id number] Arrival date & time: 01/14/24  1459     Patient presents with: Facial Swelling (R jaw)   Susan Snow is a 73 y.o. female.   73 y.o female with a PMH of HTN, Hyperlipidemia presents to the ED with a chief complaint of right-sided facial swelling which began around 11 AM today.  Patient reports she was running errands with her husband, noted some swelling to the right side of her face, this continued to worsen throughout the day.  Reports she ate ice cream approximately 2 hours prior to arrival in the ED, noted that it was painful for her to swallow as the welling has worsened.She did have her dental cleaning on Wednesday, but no manipulation. No fever, no vomiting, no difficulty tolerating her secretions.    The history is provided by the patient.       Prior to Admission medications   Medication Sig Start Date End Date Taking? Authorizing Provider  amoxicillin -clavulanate (AUGMENTIN ) 875-125 MG tablet Take 1 tablet by mouth every 12 (twelve) hours for 7 days. 01/14/24 01/21/24 Yes Tremar Wickens, PA-C  naproxen  (NAPROSYN ) 500 MG tablet Take 1 tablet (500 mg total) by mouth 2 (two) times daily for 7 days. 01/14/24 01/21/24 Yes Briar Sword, PA-C  aspirin EC 81 MG tablet Take 81 mg by mouth every morning.     [provider]  Calcium Carb-Cholecalciferol 600-5 MG-MCG TABS Take 1 tablet by mouth daily.    [provider]  Multiple Vitamins-Minerals (ADULT ONE DAILY GUMMIES) CHEW Chew 2 tablets by mouth every morning.    [provider]  olmesartan  (BENICAR ) 20 MG tablet Take 20 mg by mouth every morning.  02/16/18   [provider]  simvastatin (ZOCOR) 40 MG tablet Take 40 mg by mouth at bedtime.  02/16/18   [provider]  vitamin B-12 (CYANOCOBALAMIN) 500 MCG tablet Take 500 mcg by mouth every morning.     [provider]    Allergies: Patient has  no known allergies.    Review of Systems  Constitutional:  Negative for chills and fever.  HENT:  Positive for sore throat. Negative for rhinorrhea and trouble swallowing.   Respiratory:  Negative for shortness of breath.   Cardiovascular:  Negative for chest pain.  Gastrointestinal:  Negative for abdominal pain, nausea and vomiting.  Genitourinary:  Negative for dysuria and frequency.  Musculoskeletal:  Negative for back pain.  Neurological:  Negative for seizures and headaches.  All other systems reviewed and are negative.   Updated Vital Signs BP (!) 149/83 (BP Location: Right Arm)   Pulse 91   Temp 98 F (36.7 C) (Oral)   Resp 18   SpO2 98%   Physical Exam Vitals and nursing note reviewed.  Constitutional:      Appearance: Normal appearance.  HENT:     Head: Normocephalic.     Jaw: There is normal jaw occlusion.     Salivary Glands: Right salivary gland is diffusely enlarged and tender. Left salivary gland is not diffusely enlarged or tender.      Nose: Congestion present.     Mouth/Throat:     Lips: Pink. No lesions.     Mouth: Mucous membranes are moist.     Dentition: No dental tenderness, dental caries or dental abscesses.     Tongue: No lesions. Tongue does not deviate from midline.     Palate: No mass.  Pharynx: Oropharynx is clear. Uvula midline.     Tonsils: No tonsillar exudate or tonsillar abscesses. 1+ on the right. 1+ on the left.  Eyes:     Pupils: Pupils are equal, round, and reactive to light.  Cardiovascular:     Rate and Rhythm: Normal rate.  Pulmonary:     Effort: Pulmonary effort is normal.     Breath sounds: No wheezing or rales.  Abdominal:     General: Abdomen is flat. There is no distension.     Palpations: Abdomen is soft.     Tenderness: There is no abdominal tenderness.  Musculoskeletal:     Cervical back: Normal range of motion and neck supple.  Skin:    General: Skin is warm and dry.  Neurological:     Mental Status: She is  alert and oriented to person, place, and time.     (all labs ordered are listed, but only abnormal results are displayed) Labs Reviewed  COMPREHENSIVE METABOLIC PANEL WITH GFR - Abnormal; Notable for the following components:      Result Value   Glucose, Bld 111 (*)    Total Bilirubin 1.3 (*)    All other components within normal limits  CBC WITH DIFFERENTIAL/PLATELET - Abnormal; Notable for the following components:   WBC 11.5 (*)    RDW 11.4 (*)    Neutro Abs 7.9 (*)    All other components within normal limits    EKG: None  Radiology: CT Soft Tissue Neck W Contrast Result Date: 01/14/2024 CLINICAL DATA:  Provided history: Soft tissue infection suspected, neck, x-ray done. EXAM: CT NECK WITH CONTRAST TECHNIQUE: Multidetector CT imaging of the neck was performed using the standard protocol following the bolus administration of intravenous contrast. RADIATION DOSE REDUCTION: This exam was performed according to the departmental dose-optimization program which includes automated exposure control, adjustment of the mA and/or kV according to patient size and/or use of iterative reconstruction technique. CONTRAST:  75mL OMNIPAQUE  IOHEXOL  350 MG/ML SOLN COMPARISON:  None. FINDINGS: Pharynx and larynx: No appreciable swelling or mass within the oral cavity, pharynx or larynx. Salivary glands: Swelling and heterogeneous enhancement of the right parotid gland. Adjacent edema which extends inferiorly to the level of the mid neck. Edema is also present within the right submandibular space. However, the right submandibular gland itself appears unremarkable. Additionally, edema is identified tracking superficially along the mylohyoid muscle on the right (for instance as seen on series 4, image 105). Unremarkable appearance of the left submandibular and left parotid glands. No salivary stone is identified. Thyroid: Unremarkable. Lymph nodes: Mildly enlarged right level II lymph node, measuring 12 mm in  short axis and containing an internal low-attenuation cystic/necrotic focus (for instance as seen on series 5, image 62) (series 2, image 58). No pathologically enlarged or abnormal density lymph nodes identified elsewhere within the neck. Vascular: The major vascular structures of the neck are patent. Limited intracranial: No evidence of an acute intracranial abnormality within the field of view. Visualized orbits: No orbital mass or acute orbital finding. Mastoids and visualized paranasal sinuses: Mucosal thickening within the bilateral maxillary sinuses (mild right, moderate left). Trace scattered paranasal sinus mucosal thickening elsewhere. No significant mastoid effusion. Skeleton: Nonspecific reversal of the expected cervical lordosis. Grade 1 anterolisthesis at C3-C4 and C4-C5. Bilateral C2-C3 facet ankylosis. Spondylosis at the cervical and visualized upper thoracic levels. No acute fracture or aggressive osseous lesion. Upper chest: No consolidation within the imaged lung apices. IMPRESSION: 1. Swelling and heterogeneous enhancement of the  right parotid gland suggesting parotitis. Adjacent edema within the upper neck and tracking along the mylohyoid muscle on the right. 2. Mildly enlarged right level II cystic/necrotic lymph node. While this could potentially be secondary to an infectious etiology, cystic/necrotic lymph nodes can also be seen in setting of nodal metastatic disease. At minimum, a short-interval (3-4 week) follow-up contrast-enhanced neck CT is recommended for close surveillance. Additionally, consider direct tissue sampling. 3. Bilateral maxillary sinus mucosal thickening (mild right, moderate left). 4. Cervical and thoracic spondylosis. Grade 1 anterolisthesis at C3-C4 and C4-C5. Bilateral C2-C3 facet ankylosis. 5. Electronically Signed   By: Rockey Childs D.O.   On: 01/14/2024 19:20     Procedures   Medications Ordered in the ED  iohexol  (OMNIPAQUE ) 350 MG/ML injection 100 mL (75  mLs Intravenous Contrast Given 01/14/24 1704)                                    Medical Decision Making Amount and/or Complexity of Data Reviewed Labs: ordered. Radiology: ordered.  Risk Prescription drug management.   Patient presented to the ED with a chief complaint of right facial swelling which began around 11 AM today, this has worsened over the last several hours.  Reports last oral intake was approximately 2 hours prior to arrival in the ED consisted of ice cream.  She does have pain whenever she swallows, she feels that the swelling has not extended to below her right mandible.  She has not taken any medication for improvement in symptoms.  She did have dental cleaning 4 days ago.  No fevers, no vomiting, no nausea.  No pain with eye movement.  Realize midline, oropharynx is perfectly visualized, tonsils are not swollen and symmetric. Suspicion for Sialadenitis, interpretation of her blood work by me reveals CBC with a slight leukocytosis, hemoglobin is within normal limits.  CMP with no electrolyte derangement, creatinine levels unremarkable.  LFTs are within normal limits.  Will obtain a CT soft tissue neck in order to further evaluate.  CT soft tissue neck showed: IMPRESSION:  1. Swelling and heterogeneous enhancement of the right parotid gland  suggesting parotitis. Adjacent edema within the upper neck and  tracking along the mylohyoid muscle on the right.  2. Mildly enlarged right level II cystic/necrotic lymph node. While  this could potentially be secondary to an infectious etiology,  cystic/necrotic lymph nodes can also be seen in setting of nodal  metastatic disease. At minimum, a short-interval (3-4 week)  follow-up contrast-enhanced neck CT is recommended for close  surveillance. Additionally, consider direct tissue sampling.  3. Bilateral maxillary sinus mucosal thickening (mild right,  moderate left).  4. Cervical and thoracic spondylosis. Grade 1 anterolisthesis  at  C3-C4 and C4-C5. Bilateral C2-C3 facet ankylosis.  5.   I discussed the results with patient at length, I do feel that plain lymph node, likely from infection.  We discussed his reports of antibiotics along with anti-inflammatories.  She will have close follow-up along with repeat imaging after treatment.   Portions of this note were generated with Scientist, clinical (histocompatibility and immunogenetics). Dictation errors may occur despite best attempts at proofreading.      Final diagnoses:  Right facial swelling  Parotitis    ED Discharge Orders          Ordered    CT MAXILLOFACIAL W CONTRAST  Status:  Canceled        01/14/24 1641  amoxicillin -clavulanate (AUGMENTIN ) 875-125 MG tablet  Every 12 hours        01/14/24 1928    naproxen  (NAPROSYN ) 500 MG tablet  2 times daily        01/14/24 1928               Clovis Mankins, PA-C 01/14/24 1928    Horton, Kristie M, DO 01/15/24 0020

## 2024-01-24 ENCOUNTER — Other Ambulatory Visit: Payer: Self-pay | Admitting: Family Medicine

## 2024-01-24 DIAGNOSIS — R221 Localized swelling, mass and lump, neck: Secondary | ICD-10-CM

## 2024-02-05 ENCOUNTER — Ambulatory Visit
Admission: RE | Admit: 2024-02-05 | Discharge: 2024-02-05 | Disposition: A | Discharge status: Home / Self Care | Payer: PRIVATE HEALTH INSURANCE | Source: Ambulatory Visit | Attending: Family Medicine | Admitting: Family Medicine

## 2024-02-05 DIAGNOSIS — R221 Localized swelling, mass and lump, neck: Secondary | ICD-10-CM

## 2024-02-05 MED ORDER — IOPAMIDOL (ISOVUE-300) INJECTION 61%
75.0000 mL | Freq: Once | INTRAVENOUS | Status: AC | PRN
  Administered 2024-02-05 (×2): 75 mL via INTRAVENOUS

## 2024-02-19 ENCOUNTER — Ambulatory Visit (INDEPENDENT_AMBULATORY_CARE_PROVIDER_SITE_OTHER): Payer: PRIVATE HEALTH INSURANCE | Admitting: Otolaryngology

## 2024-02-19 ENCOUNTER — Encounter (INDEPENDENT_AMBULATORY_CARE_PROVIDER_SITE_OTHER): Payer: Self-pay | Admitting: Otolaryngology

## 2024-02-19 VITALS — BP 151/91 | HR 91 | Ht 64.0 in | Wt 178.0 lb

## 2024-02-19 DIAGNOSIS — R221 Localized swelling, mass and lump, neck: Secondary | ICD-10-CM | POA: Diagnosis not present

## 2024-02-19 NOTE — Progress Notes (Signed)
 Dear Dr. Debrah, Here is my assessment for our mutual patient, Susan Snow. Thank you for allowing me the opportunity to care for your patient. Please do not hesitate to contact me should you have any other questions. Sincerely, Dr. Eldora Blanch  Otolaryngology Clinic Note Referring provider: Dr. Debrah HPI:  Susan Snow is a 73 y.o. female kindly referred by Dr. Debrah for evaluation of right neck mass and swelling  Initial visit (01/2024): She reports that she underwent a dental cleaning and a few days later, she had some right cheek swelling (happened suddenly). During this, she did not have any pain or redness, just the swelling. Went to ED, noted parotitis and treated for his with antibiotics with resolution. In the process, she was noted to have a right Level 2 LN with suspicion for necrosis and it was persistent on repeat CT and sent here. No neck pain at the time. No skin cancers Patient currently reports no symptoms H&N wise as below. Swelling has completely resolved. No neck pain. 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; slight right parotid increased density but no significant surrounding cellulitis or stranding, stable in size, some asymmetry right GT sulcus; medialized carotid R>L; CT Neck 01/14/2024 - some right  parotid/cheek stranding, stable neck mass size PMH/Meds/All/SocHx/FamHx/ROS:   Past Medical History:  Diagnosis Date   Hyperlipidemia    Hypertension      Past Surgical History:  Procedure Laterality Date   BREAST EXCISIONAL BIOPSY Right 1978   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 No pertinent family history.   Social Connections: Unknown (05/27/2019)   Social Connection and Isolation Panel    Frequency of Communication with Friends and Family: Patient declined    Frequency of Social Gatherings with Friends and Family: Patient declined    Attends Religious Services: Patient declined    Database administrator or Organizations: Patient declined    Attends Engineer, structural: Patient declined    Marital Status: Patient declined      Current Outpatient Medications:    aspirin EC 81 MG tablet, Take 81 mg by mouth every morning. , Disp: , Rfl:    Calcium Carb-Cholecalciferol 600-5 MG-MCG TABS, Take 1 tablet by mouth daily., Disp: , Rfl:    Multiple Vitamins-Minerals (ADULT ONE DAILY GUMMIES) CHEW, Chew 2 tablets by mouth every morning., Disp: , Rfl:    olmesartan  (BENICAR ) 20 MG tablet, Take 20 mg by mouth every morning. , Disp: , Rfl: 3   simvastatin (ZOCOR) 40 MG tablet, Take 40 mg by mouth at bedtime. , Disp: , Rfl: 3   vitamin B-12 (CYANOCOBALAMIN) 500 MCG tablet, Take 500 mcg by mouth every morning. , Disp: , Rfl:    Vitamin D,  Ergocalciferol, (DRISDOL) 1.25 MG (50000 UNIT) CAPS capsule, Take 50,000 Units by mouth once a week., Disp: , Rfl:    Physical Exam:   BP (!) 151/91 (BP Location: Left Arm, Patient Position: Sitting, Cuff Size: Large)   Pulse 91   Ht 5' 4 (1.626 m)   Wt 178 lb (80.7 kg)   SpO2 93%   BMI 30.55 kg/m   Salient findings:  CN II-XII intact Bilateral EAC clear and TM intact with  well pneumatized middle ear spaces Anterior rhinoscopy: Septum intact; bilateral inferior turbinates without significant hypertrophy No lesions of oral cavity/oropharynx; no palpable GT sulcus lesion or tonsil lesion; strong gag No obviously palpable neck masses/lymphadenopathy/thyromegaly except slightly palpable outline of right level 2 mass, not significantly firm No respiratory distress or stridor; TFL was indicated to better evaluate the proximal airway, given the patient's history and exam findings, and is detailed below.  Seprately Identifiable Procedures:  Prior to initiating any procedures, risks/benefits/alternatives were explained to the patient and verbal consent obtained. Procedure Note Pre-procedure diagnosis: Neck mass, rule out primary lesion Post-procedure diagnosis: Same Procedure: Transnasal Fiberoptic Laryngoscopy, CPT 31575 - Mod 25 Indication: see above Complications: None apparent EBL: 0 mL  The procedure was undertaken to further evaluate the patient's complaint above, with mirror exam inadequate for appropriate examination due to gag reflex and poor patient tolerance  Procedure:  Patient was identified as correct patient. Verbal consent was obtained. The nose was sprayed with oxymetazoline and 4% lidocaine . The The flexible laryngoscope was passed through the nose to view the nasal cavity, pharynx (oropharynx, hypopharynx) and larynx.  The larynx was examined at rest and during multiple phonatory tasks. Documentation was obtained and reviewed with patient. The scope was removed. The patient tolerated the procedure well.  Findings: The nasal cavity and nasopharynx did not reveal any masses or lesions, mucosa appeared to be without obvious lesions. The tongue base, pharyngeal walls, piriform sinuses, vallecula, epiglottis and postcricoid region are normal in appearance - slightly prominent lingual tonsil on right. The visualized portion of the subglottis and proximal  trachea is widely patent. The vocal folds are mobile bilaterally. There are no lesions on the free edge of the vocal folds nor elsewhere in the larynx worrisome for malignancy.      Electronically signed by: Eldora KATHEE Blanch, MD 02/19/2024 4:27 PM   Impression & Plans:  Ashli Selders is a 73 y.o. female with:  1. Mass of right side of neck    We discussed DDX - wonder if this is not inflammatory as neck did not have other LN at time of parotitis and no significant stranding in neck. Persistent now, and other DX includes branchial cleft v/s malignancy.   After discussion of options which include continued imaging follow up closely, we decided to do an FNA  - f/u 3 weeks with FNA, sooner as necessary  See below regarding exact medications prescribed this encounter including dosages and route: No orders of the defined types were placed in this encounter.     Thank you for allowing me the opportunity to care for your patient. Please do not hesitate to contact me should you have any other questions.  Sincerely, Eldora Blanch, MD Otolaryngologist (ENT), [facility] Phone: [phone] Fax: [phone]  02/19/2024, 4:27 PM   MDM:  Level 4 - [id number] Complexity/Problems addressed: mod - new problem, unknown diagnosis and prognosis needing further testing Data complexity: mod  independent interpretation of imaging - Morbidity: low currently  - Prescription Drug  prescribed or managed: no

## 2024-02-19 NOTE — Patient Instructions (Signed)
 I have ordered an imaging study for you to complete prior to your next visit. Please call [facility] Scheduling at [phone] to schedule your imaging if you have not received a call within 24 hours. If you are unable to complete your imaging study prior to your next scheduled visit please call our office to let us  know.

## 2024-02-20 ENCOUNTER — Encounter (HOSPITAL_COMMUNITY): Payer: Self-pay

## 2024-02-20 NOTE — Progress Notes (Signed)
 Patient for US  guided core RT LN biopsy on Wed 02/21/24, I called and spoke with the patient on the phone and gave pre-procedure instructions. Pt was made aware to be here at 12:30p and check in at the [facility] registration desk. Pt stated understanding. Called 02/20/24

## 2024-02-20 NOTE — Progress Notes (Signed)
 Susan Snow LABOR, MD  Susan Snow PROCEDURE / BIOPSY REVIEW Date: 02/19/24  Requested Biopsy site: right neck Reason for request: right neck mass, concern for carcinoma - please do not aspirate entire mass in case we need to resect; test for p16   Imaging review: Best seen on CT  Decision: Approved Imaging modality to perform: Ultrasound Schedule with: No sedation / Local anesthetic Schedule for: Any VIR  Additional comments: @Schedulers .  Please contact me with questions, concerns, or if issue pertaining to this request arise.  Snow LABOR [name], MD [facility]       Previous Messages    ----- Message ----- From: [name] Sent: 02/19/2024   3:28 PM EDT To: [name]; Ir Procedure Requests Subject: US  FNA BIOPSY SOFT TISSUE 1ST LESION          Procedure : US  FNA BIOPSY SOFT TISSUE 1ST LESION  Reason: right neck mass, concern for carcinoma - please do not aspirate entire mass in case we need to resect; test for p16 Dx: Mass of right side of neck [R22.1 (ICD-10-CM)]  Ordering Comments  right neck mass, concern for carcinoma - please do not aspirate entire mass in case we need to resect; test for p16    History : CT soft tissue neck w/ contrast   Provider : [name], MD  Contact : [phone]

## 2024-02-21 ENCOUNTER — Ambulatory Visit
Admission: RE | Admit: 2024-02-21 | Discharge: 2024-02-21 | Disposition: A | Discharge status: Home / Self Care | Payer: PRIVATE HEALTH INSURANCE | Source: Ambulatory Visit | Attending: Otolaryngology | Admitting: Otolaryngology

## 2024-02-21 ENCOUNTER — Other Ambulatory Visit (INDEPENDENT_AMBULATORY_CARE_PROVIDER_SITE_OTHER): Payer: Self-pay | Admitting: Otolaryngology

## 2024-02-21 DIAGNOSIS — R221 Localized swelling, mass and lump, neck: Secondary | ICD-10-CM

## 2024-02-21 DIAGNOSIS — C969 Malignant neoplasm of lymphoid, hematopoietic and related tissue, unspecified: Secondary | ICD-10-CM | POA: Insufficient documentation

## 2024-02-21 MED ORDER — LIDOCAINE HCL (PF) 1 % IJ SOLN
10.0000 mL | Freq: Once | INTRAMUSCULAR | Status: AC
  Administered 2024-02-21: 10 mL via INTRADERMAL
  Filled 2024-02-21: qty 10

## 2024-02-21 NOTE — Procedures (Signed)
 Interventional Radiology Procedure:   Indications: History of right parotiditis based on imaging with a persistent enlarged right cervical lymph node near right parotid gland   Procedure: US  guided aspiration and core biopsy of right cervical lymph node  Findings: Dilatation of right parotid ducts.  Aspirated 1-2 ml of pink purulent fluid from right cervical lymph node and obtained 3 core biopsies from the node.   Complications: None     EBL: Minimal  Plan: Discharge patient to home.   Kona Lover R. Philip, MD  Pager: [phone]

## 2024-02-23 ENCOUNTER — Telehealth (INDEPENDENT_AMBULATORY_CARE_PROVIDER_SITE_OTHER): Payer: Self-pay | Admitting: Otolaryngology

## 2024-02-26 DIAGNOSIS — C801 Malignant (primary) neoplasm, unspecified: Secondary | ICD-10-CM

## 2024-02-26 HISTORY — DX: Malignant (primary) neoplasm, unspecified: C80.1

## 2024-02-26 LAB — AEROBIC/ANAEROBIC CULTURE W GRAM STAIN (SURGICAL/DEEP WOUND): Culture: NO GROWTH

## 2024-02-27 LAB — CYTOLOGY - NON PAP

## 2024-02-27 LAB — SURGICAL PATHOLOGY

## 2024-02-28 ENCOUNTER — Telehealth (INDEPENDENT_AMBULATORY_CARE_PROVIDER_SITE_OTHER): Payer: Self-pay

## 2024-02-28 DIAGNOSIS — R221 Localized swelling, mass and lump, neck: Secondary | ICD-10-CM

## 2024-02-28 NOTE — Telephone Encounter (Signed)
 Patient left a message wanting to know if her biopsy results were back.  Please call at [phone]

## 2024-02-29 NOTE — Addendum Note (Signed)
 Addended by: Esparanza Krider on: 02/29/2024 02:24 PM   Modules accepted: Orders

## 2024-02-29 NOTE — Telephone Encounter (Signed)
 I have ordered an imaging study for you to complete prior to your next visit. Please call [facility] Scheduling at [phone] to schedule your imaging if you have not received a call within 24 hours. If you are unable to complete your imaging study prior to your next scheduled visit please call our office to let us  know.   Talked to her about results, will schedule PET

## 2024-03-06 ENCOUNTER — Encounter (HOSPITAL_COMMUNITY): Payer: Self-pay

## 2024-03-06 ENCOUNTER — Other Ambulatory Visit (HOSPITAL_COMMUNITY): Payer: PRIVATE HEALTH INSURANCE

## 2024-03-06 ENCOUNTER — Ambulatory Visit (HOSPITAL_COMMUNITY)
Admission: RE | Admit: 2024-03-06 | Discharge: 2024-03-06 | Disposition: A | Discharge status: Home / Self Care | Payer: PRIVATE HEALTH INSURANCE | Source: Ambulatory Visit | Attending: Otolaryngology | Admitting: Otolaryngology

## 2024-03-06 DIAGNOSIS — R221 Localized swelling, mass and lump, neck: Secondary | ICD-10-CM | POA: Insufficient documentation

## 2024-03-06 LAB — GLUCOSE, CAPILLARY: Glucose-Capillary: 106 mg/dL — ABNORMAL HIGH (ref 70–99)

## 2024-03-06 MED ORDER — FLUDEOXYGLUCOSE F - 18 (FDG) INJECTION
8.9000 | Freq: Once | INTRAVENOUS | Status: AC | PRN
  Administered 2024-03-06: 9.4 via INTRAVENOUS

## 2024-03-11 ENCOUNTER — Ambulatory Visit (INDEPENDENT_AMBULATORY_CARE_PROVIDER_SITE_OTHER): Payer: PRIVATE HEALTH INSURANCE | Admitting: Otolaryngology

## 2024-03-11 ENCOUNTER — Encounter (INDEPENDENT_AMBULATORY_CARE_PROVIDER_SITE_OTHER): Payer: Self-pay | Admitting: Otolaryngology

## 2024-03-11 VITALS — BP 176/102 | HR 84

## 2024-03-11 DIAGNOSIS — R221 Localized swelling, mass and lump, neck: Secondary | ICD-10-CM | POA: Diagnosis not present

## 2024-03-11 DIAGNOSIS — C7989 Secondary malignant neoplasm of other specified sites: Secondary | ICD-10-CM | POA: Diagnosis not present

## 2024-03-11 NOTE — Progress Notes (Signed)
 Patient explains that she is anxious. Took BP twice.

## 2024-03-11 NOTE — Progress Notes (Signed)
 Dear Dr. Debrah, Here is my assessment for our mutual patient, Susan Snow. Thank you for allowing me the opportunity to care for your patient. Please do not hesitate to contact me should you have any other questions. Sincerely, Dr. Eldora Blanch  Otolaryngology Clinic Note Referring provider: Dr. Debrah HPI:  Susan Snow is a 73 y.o. female kindly referred by Dr. Debrah for evaluation of right neck mass and swelling  Initial visit (01/2024): She reports that she underwent a dental cleaning and a few days later, she had some right cheek swelling (happened suddenly). During this, she did not have any pain or redness, just the swelling. Went to ED, noted parotitis and treated for his with antibiotics with resolution. In the process, she was noted to have a right Level 2 LN with suspicion for necrosis and it was persistent on repeat CT and sent here. No neck pain at the time. No skin cancers Patient currently reports no symptoms H&N wise as below. Swelling has completely resolved. No neck pain. No throat mass Patient otherwise denies: - dysphagia, odynophagia, unintentional weight loss - changes in voice, shortness of breath, hemoptysis tobacco or significant alcohol history - ear pain, neck masses  --------------------------------------------------------- 03/11/2024 Seen in f/u. We had a long discussion regarding options and her PET and biopsy. She continues to fairly well from symptom standpoint.  ENT Surgery: TMJ surgery (40 years ago) Personal or FHx of bleeding dz or anesthesia difficulty: no  GLP-1: no AP/AC: ASA 81  Tobacco: no  PMHx: HTN, HLD  Independent Review of Additional Tests or Records:  Dr. 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 No other noted areas in H&N of suspicious PET avidity. PMH/Meds/All/SocHx/FamHx/ROS:   Past Medical History:  Diagnosis Date   Hyperlipidemia    Hypertension      Past Surgical History:  Procedure Laterality Date   BREAST EXCISIONAL BIOPSY Right 1978   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 No pertinent family history.   Social Connections: Unknown (05/27/2019)   Social Connection and Isolation Panel    Frequency of Communication with Friends and Family: Patient declined    Frequency of Social Gatherings with Friends and Family: Patient declined    Attends Religious Services: Patient declined    Database administrator or Organizations: Patient declined    Attends Engineer, structural: Patient declined    Marital Status: Patient declined      Current Outpatient Medications:    aspirin EC 81 MG tablet, Take 81 mg by mouth every morning. , Disp: , Rfl:    Calcium Carb-Cholecalciferol 600-5 MG-MCG TABS, Take 1 tablet by mouth daily., Disp: , Rfl:    Multiple Vitamins-Minerals (ADULT ONE DAILY GUMMIES) CHEW, Chew 2 tablets by mouth every morning., Disp: ,  Rfl:    olmesartan  (BENICAR ) 20 MG tablet, Take 20 mg by mouth every morning. , Disp: , Rfl: 3   simvastatin (ZOCOR) 40 MG tablet, Take 40 mg by mouth at bedtime. , Disp: , Rfl: 3   vitamin B-12 (CYANOCOBALAMIN) 500 MCG tablet, Take 500 mcg  by mouth every morning. , Disp: , Rfl:    Vitamin D, Ergocalciferol, (DRISDOL) 1.25 MG (50000 UNIT) CAPS capsule, Take 50,000 Units by mouth once a week., Disp: , Rfl:    Physical Exam:   BP (!) 176/102   Pulse 84   SpO2 94%   Salient findings:  CN II-XII intact Bilateral EAC clear and TM intact with well pneumatized middle ear spaces Anterior rhinoscopy: Septum intact; bilateral inferior turbinates without significant hypertrophy No lesions of oral cavity/oropharynx; no palpable GT sulcus lesion or tonsil lesion; strong gag No obviously palpable neck masses/lymphadenopathy/thyromegaly except slightly palpable outline of right level 2 mass, not significantly firm No respiratory distress or stridor  Seprately Identifiable Procedures:  Prior to initiating any procedures, risks/benefits/alternatives were explained to the patient and verbal consent obtained. Procedure Note (Prior, not today) Pre-procedure diagnosis: Neck mass, rule out primary lesion Post-procedure diagnosis: Same Procedure: Transnasal Fiberoptic Laryngoscopy, CPT 31575 - Mod 25 Indication: see above Complications: None apparent EBL: 0 mL  The procedure was undertaken to further evaluate the patient's complaint above, with mirror exam inadequate for appropriate examination due to gag reflex and poor patient tolerance  Procedure:  Patient was identified as correct patient. Verbal consent was obtained. The nose was sprayed with oxymetazoline and 4% lidocaine . The The flexible laryngoscope was passed through the nose to view the nasal cavity, pharynx (oropharynx, hypopharynx) and larynx.  The larynx was examined at rest and during multiple phonatory tasks. Documentation was obtained and reviewed with patient. The scope was removed. The patient tolerated the procedure well.  Findings: The nasal cavity and nasopharynx did not reveal any masses or lesions, mucosa appeared to be without obvious lesions. The tongue base,  pharyngeal walls, piriform sinuses, vallecula, epiglottis and postcricoid region are normal in appearance - slightly prominent lingual tonsil on right. The visualized portion of the subglottis and proximal trachea is widely patent. The vocal folds are mobile bilaterally. There are no lesions on the free edge of the vocal folds nor elsewhere in the larynx worrisome for malignancy.      Electronically signed by: Eldora KATHEE Blanch, MD 03/11/2024 3:09 PM   Impression & Plans:  Susan Snow is a 73 y.o. female with:  1. 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 V/s GT sulcus  We had a long discussion re: options - surgical v/s primary RT. We discussed R/B/A and need for search of primary --- would recommend at least DL/Bx with ipsilateral tonsillectomy, possible bilateral and directed biopsies with frozen section. If she wishes for therapeutic ND, if only 1 node positive and favorable path, can consider observation. Otherwise, radiation would be at a lower dose.  After initial conversation, she would like to proceed with surgery but will consult Rad/Onc just to get their perspective. Added on TB this week.  Phone visit this Wednesday after TB. Can plan next steps afterwards   See below regarding exact medications prescribed this encounter including dosages and route: No orders of the defined types were placed in  this encounter.     Thank you for allowing me the opportunity to care for your patient. Please do not hesitate to contact me should you have any other questions.  Sincerely, Eldora Blanch, MD Otolaryngologist (ENT), [facility] Phone: [phone] Fax: [phone]  03/11/2024, 3:09 PM   I have personally spent 41 minutes involved in face-to-face and non-face-to-face activities for this patient on the day  of the visit.  Professional time spent excludes any procedures performed but includes the following activities, in addition to those noted in the documentation: preparing to see the patient (review of outside documentation and results), performing a medically appropriate examination, extensive counseling, documenting in the electronic health record, independently interpreting results (PET)

## 2024-03-13 ENCOUNTER — Ambulatory Visit (INDEPENDENT_AMBULATORY_CARE_PROVIDER_SITE_OTHER): Payer: PRIVATE HEALTH INSURANCE | Admitting: Otolaryngology

## 2024-03-13 DIAGNOSIS — C7989 Secondary malignant neoplasm of other specified sites: Secondary | ICD-10-CM

## 2024-03-13 NOTE — Patient Instructions (Signed)
 I have ordered an imaging study for you to complete prior to your next visit. Please call [facility] Scheduling at [phone] to schedule your imaging if you have not received a call within 24 hours. If you are unable to complete your imaging study prior to your next scheduled visit please call our office to let us  know.

## 2024-03-14 ENCOUNTER — Encounter (HOSPITAL_COMMUNITY): Payer: Self-pay

## 2024-03-14 ENCOUNTER — Other Ambulatory Visit: Payer: Self-pay

## 2024-03-14 DIAGNOSIS — R590 Localized enlarged lymph nodes: Secondary | ICD-10-CM

## 2024-03-14 NOTE — Progress Notes (Signed)
 Radiation Oncology         [phone] ________________________________  Initial Outpatient Consultation  Name: Susan Snow MRN: [id number]  Date: 03/15/2024  DOB: 25-Jan-1951  RR:Xjeojw, Josette ORN., PA-C  [name], MD   REFERRING PHYSICIAN: [name], MD  DIAGNOSIS: No diagnosis found.   Cancer Staging  No matching staging information was found for the patient.  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 female who initially presented to the ED on 01/14/24 with right sided facial swelling which began earlier that day. A soft tissue neck CT was accordingly performed at that time which showed: swelling and heterogeneous enhancement of the right parotid gland suggestive of parotitis along with adjacent edema within the upper neck and tracking along the mylohyoid muscle on the right, and a mildly enlarged right level II cystic/necrotic lymph node. Other findings of potential clinical significance included bilateral maxillary sinus mucosal thickening (mild on the right and moderate on the left), and cervical and thoracic spondylosis. Her imaging findings were favored to be infectious in etiology and she was prescribed a course of abx and anti-inflammatories at discharge. (Of note: She did report having a routine dental cleaning approximately 4 days prior to her symptom onset, although she denied having any procedures done that would result in swelling).   The patient conveyed the details of her ED encounter to her PCP who recommended proceeding with interval repeat imaging of the neck. She accordingly presented for a follow-up soft tissue neck CT on 02/05/24 which demonstrated: the enlarged right level 2A cervical node measuring 1.4 x 1.3 x 1.6 cm with central hypoattenuation, concerning for central necrosis, and subtle  increased density of the right parotid gland without associated inflammatory stranding or mass present. Imaging otherwise showed no masses or abnormal enhancements along the aerodigestive structures.   Given these persistent CT findings, she was referred to [facility] and she was seen in consultation by Dr. Tobie on 02/19/24. Her right sided facial swelling had resolved by the time of this visit and she denied any head or neck symptoms overall. Physical examination performed during this visit however confirmed a slightly palpable right level 2 neck mass. No other abnormalities were appreciated on examination. A laryngoscopy was also performed at that time which was unremarkable other than slight prominence of the right lingual tonsil.   Based on her discussion with Dr. Tobie, she opted to proceed with a US  guided biopsy of the right upper neck mass/LN on 02/21/24. Pathology showed findings consistent with squamous cell carcinoma; p16 positive. The same specimen was also sent for cytology which also showed SCC.  Dr. Tobie has reviewed these results with the patient and has recommended proceeding with further diagnostic tissue sampling which would also include a possible tonsillectomy given her laryngoscopy findings. She is agreeable to Dr. Anthony recommendations but would also like to learn more about the role of radiation therapy which we will discuss in detail today.   Pertinent imaging performed thus far includes a PET scan on 03/06/24 which demonstrated: the right level IIA lymph node measuring 1.5 cm compatible with malignancy, and mildly asymmetric activity along the right palatoglossal arch/anterior pillar of fauces. No other asymmetric findings or significant activity was demonstrated elsewhere in the body to suggest a primary site.   Swallowing issues, if any: none  Weight Changes:  none  Pain status: none  Other symptoms: presented with right sided facial swelling which has since resolved    Tobacco history, if any: none  ETOH abuse, if any: none - reports consuming approximately 1.0 standard drinks per a week  Prior cancers, if any: none  PREVIOUS RADIATION THERAPY: No  PAST MEDICAL HISTORY:  has a past medical history of Hyperlipidemia and Hypertension.    PAST SURGICAL HISTORY: Past Surgical History:  Procedure Laterality Date   BREAST EXCISIONAL BIOPSY Right 1978   REVERSE SHOULDER ARTHROPLASTY Left 05/29/2019   Procedure: REVERSE SHOULDER ARTHROPLASTY;  Surgeon: Cristy Bonner DASEN, MD;  Location: [facility];  Service: Orthopedics;  Laterality: Left;    FAMILY HISTORY: family history is not on file.  SOCIAL HISTORY:  reports that she has never smoked. She has never used smokeless tobacco. She reports current alcohol use of about 1.0 standard drink of alcohol per week. She reports that she does not use drugs.  ALLERGIES: Olmesartan   MEDICATIONS:  Current Outpatient Medications  Medication Sig Dispense Refill   aspirin EC 81 MG tablet Take 81 mg by mouth every morning.      Calcium Carb-Cholecalciferol 600-5 MG-MCG TABS Take 1 tablet by mouth daily.     Multiple Vitamins-Minerals (ADULT ONE DAILY GUMMIES) CHEW Chew 2 tablets by mouth every morning.     olmesartan  (BENICAR ) 20 MG tablet Take 20 mg by mouth every morning.   3   simvastatin (ZOCOR) 40 MG tablet Take 40 mg by mouth at bedtime.   3   vitamin B-12 (CYANOCOBALAMIN) 500 MCG tablet Take 500 mcg by mouth every morning.      Vitamin D, Ergocalciferol, (DRISDOL) 1.25 MG (50000 UNIT) CAPS capsule Take 50,000 Units by mouth once a week.     No current facility-administered medications for this encounter.    REVIEW OF SYSTEMS:  Notable for that above.   PHYSICAL EXAM:  vitals were not taken for this visit.   General: Alert and oriented, in no acute distress HEENT: Head is normocephalic. Extraocular movements are intact. Oropharynx is notable for ***. Neck: Neck is notable for *** Heart: Regular in rate and  rhythm with no murmurs, rubs, or gallops. Chest: Clear to auscultation bilaterally, with no rhonchi, wheezes, or rales. Abdomen: Soft, nontender, nondistended, with no rigidity or guarding. Extremities: No cyanosis or edema. Lymphatics: see Neck Exam Skin: No concerning lesions. Musculoskeletal: symmetric strength and muscle tone throughout. Neurologic: Cranial nerves II through XII are grossly intact. No obvious focalities. Speech is fluent. Coordination is intact. Psychiatric: Judgment and insight are intact. Affect is appropriate.   ECOG = ***  0 - Asymptomatic (Fully active, able to carry on all predisease activities without restriction)  1 - Symptomatic but completely ambulatory (Restricted in physically strenuous activity but ambulatory and able to carry out work of a light or sedentary nature. For example, light housework, office work)  2 - Symptomatic, <50% in bed during the day (Ambulatory and capable of all self care but unable to carry out any work activities. Up and about more than 50% of waking hours)  3 - Symptomatic, >50% in bed, but not bedbound (Capable of only limited self-care, confined to bed or chair 50% or more of waking hours)  4 - Bedbound (Completely disabled. Cannot carry on any self-care. Totally confined to bed or chair)  5 - Death   Raylene MM, Creech RH, Tormey DC, et al. [phone]). Toxicity and response criteria of the [facility]. Am. JINNY.  Clin. Oncol. 5 (6): 649-55   LABORATORY DATA:  Lab Results  Component Value Date   WBC 11.5 (H) 01/14/2024   HGB 14.2 01/14/2024   HCT 41.0 01/14/2024   MCV 97.4 01/14/2024   PLT 323 01/14/2024   CMP     Component Value Date/Time   NA 139 01/14/2024 1514   K 4.1 01/14/2024 1514   CL 102 01/14/2024 1514   CO2 23 01/14/2024 1514   GLUCOSE 111 (H) 01/14/2024 1514   BUN 14 01/14/2024 1514   CREATININE 0.84 01/14/2024 1514   CALCIUM 9.6 01/14/2024 1514   PROT 7.7 01/14/2024 1514   ALBUMIN  4.6 01/14/2024 1514   AST 28 01/14/2024 1514   ALT 29 01/14/2024 1514   ALKPHOS 94 01/14/2024 1514   BILITOT 1.3 (H) 01/14/2024 1514   GFRNONAA >60 01/14/2024 1514   GFRAA >60 05/27/2019 0347      No results found for: TSH   RADIOGRAPHY: NM PET Image Initial (PI) Skull Base To Thigh (F-18 FDG) Addendum Date: 03/13/2024 ADDENDUM REPORT: 03/13/2024 09:07 ADDENDUM: The original report was by Dr. Ryan Salvage. The following addendum is by Dr. Ryan Salvage: At otolaryngology cancer conference on 03/13/2024, a region of nodularity along the inferior margin of the right anterior belly of the digastric muscle was discussed, in the vicinity of the submental vein. On the PET CT of 03/06/2024, this region was not hypermetabolic. Electronically Signed   By: Ryan Salvage M.D.   On: 03/13/2024 09:07   Result Date: 03/13/2024 CLINICAL DATA:  Initial treatment strategy for squamous cell carcinoma of the neck, found in a IIa cervical lymph node, primary uncertain. EXAM: NUCLEAR MEDICINE PET SKULL BASE TO THIGH TECHNIQUE: 9.4 mCi F-18 FDG was injected intravenously. Full-ring PET imaging was performed from the skull base to thigh after the radiotracer. CT data was obtained and used for attenuation correction and anatomic localization. Fasting blood glucose: 106 mg/dl COMPARISON:  CT neck 1/88/7974 FINDINGS: Mediastinal blood pool activity: SUV max 3.0 Liver activity: SUV max NA NECK: The right level IIa lymph node measures 1.5 cm in short axis on image 33 series 4 with maximum SUV 6.0. Mildly asymmetric activity along the right palatoglossal arch/anterior pillar of fauces compared to the left, maximum SUV 6.3 on the right compared to 4.6 on the left. No other asymmetric findings or significant activity to otherwise identify primary location. Incidental CT findings: None. CHEST: No significant abnormal hypermetabolic activity in this region. Incidental CT findings: Coronary, aortic arch, and branch vessel  atherosclerotic vascular disease. Mild cardiomegaly. ABDOMEN/PELVIS: No significant abnormal hypermetabolic activity in this region. Incidental CT findings: Mild left hydronephrosis without hydroureter, query UPJ narrowing. Nonobstructive left nephrolithiasis. Sigmoid colon diverticulosis. SKELETON: No significant abnormal hypermetabolic activity in this region. Incidental CT findings: Left reverse shoulder arthroplasty. Muscular atrophy/sarcopenia especially in the posterior paraspinal region. IMPRESSION: 1. The right level IIA lymph node measures 1.5 cm in short axis with maximum SUV 6.0, compatible with malignancy. 2. Mildly asymmetric activity along the right palatoglossal arch/anterior pillar of fauces compared to the left, maximum SUV 6.3 on the right compared to 4.6 on the left. No other asymmetric findings or significant activity to otherwise identify primary location. 3. Mild left hydronephrosis without hydroureter, query UPJ narrowing. Nonobstructive left nephrolithiasis. 4. Sigmoid colon diverticulosis. 5. Muscular atrophy/sarcopenia especially in the posterior paraspinal region. 6.  Aortic Atherosclerosis (ICD10-I70.0). Electronically Signed: By: Ryan Salvage M.D. On: 03/06/2024 15:41   US  CORE BIOPSY (LYMPH NODES) Result Date: 02/21/2024 INDICATION: 73 year old  with a persistently enlarged level 2 right cervical lymph node. Evidence for right parotiditis on the CT from 01/14/2024. EXAM: 1. Ultrasound-guided aspiration of right cervical lymph node 2. Ultrasound-guided core biopsy of right cervical lymph node MEDICATIONS: 1% lidocaine  for local anesthetic ANESTHESIA/SEDATION: None PROCEDURE: The procedure was explained to the patient. The risks and benefits of the procedure were discussed and the patient's questions were addressed. Informed consent was obtained from the patient. Time-out was performed. Right side of the neck was evaluated with ultrasound. Abnormal lymph node in the right upper  neck near the right parotid gland was identified. Right side of the neck was prepped with chlorhexidine  and sterile field was created. Skin was anesthetized using 1% lidocaine . Using ultrasound guidance, 1-2 mL of pink purulent fluid was aspirated from the lymph node using a 20 gauge needle. After the aspiration, the lymph node was still visible but slightly prominent. As a result, a 17 gauge coaxial needle was directed into the lymph node and 3 core biopsies were obtained with an 18 gauge device. Core specimens placed on a Telfa pad with saline. 17 gauge needle was removed without complication. Bandage placed over the puncture site. COMPLICATIONS: None immediate. FINDINGS: Enlarged cervical lymph node in the right upper neck adjacent to the right parotid gland. Lymph node contained some fluid. 1-2 mL of pink purulent fluid was removed from the lymph node. Subsequently, core biopsies were obtained. No immediate bleeding or hematoma formation. Evidence for duct dilatation in the right parotid gland. No duct dilatation identified in the left parotid gland. IMPRESSION: 1. Ultrasound-guided aspiration and core biopsy of the level 2 right cervical lymph node. 1-2 mL of pink purulent fluid was aspirated from the lymph node. Specimens were sent for surgical pathology, cytology and culture. 2. Duct dilatation in the right parotid gland. Findings may be associated with previous inflammation. Electronically Signed   By: Juliene Balder M.D.   On: 02/21/2024 16:14   US  FNA SOFT TISSUE Result Date: 02/21/2024 INDICATION: 73 year old with a persistently enlarged level 2 right cervical lymph node. Evidence for right parotiditis on the CT from 01/14/2024. EXAM: 1. Ultrasound-guided aspiration of right cervical lymph node 2. Ultrasound-guided core biopsy of right cervical lymph node MEDICATIONS: 1% lidocaine  for local anesthetic ANESTHESIA/SEDATION: None PROCEDURE: The procedure was explained to the patient. The risks and benefits of  the procedure were discussed and the patient's questions were addressed. Informed consent was obtained from the patient. Time-out was performed. Right side of the neck was evaluated with ultrasound. Abnormal lymph node in the right upper neck near the right parotid gland was identified. Right side of the neck was prepped with chlorhexidine  and sterile field was created. Skin was anesthetized using 1% lidocaine . Using ultrasound guidance, 1-2 mL of pink purulent fluid was aspirated from the lymph node using a 20 gauge needle. After the aspiration, the lymph node was still visible but slightly prominent. As a result, a 17 gauge coaxial needle was directed into the lymph node and 3 core biopsies were obtained with an 18 gauge device. Core specimens placed on a Telfa pad with saline. 17 gauge needle was removed without complication. Bandage placed over the puncture site. COMPLICATIONS: None immediate. FINDINGS: Enlarged cervical lymph node in the right upper neck adjacent to the right parotid gland. Lymph node contained some fluid. 1-2 mL of pink purulent fluid was removed from the lymph node. Subsequently, core biopsies were obtained. No immediate bleeding or hematoma formation. Evidence for duct dilatation in the right parotid  gland. No duct dilatation identified in the left parotid gland. IMPRESSION: 1. Ultrasound-guided aspiration and core biopsy of the level 2 right cervical lymph node. 1-2 mL of pink purulent fluid was aspirated from the lymph node. Specimens were sent for surgical pathology, cytology and culture. 2. Duct dilatation in the right parotid gland. Findings may be associated with previous inflammation. Electronically Signed   By: Juliene Balder M.D.   On: 02/21/2024 16:14      IMPRESSION/PLAN:  This is a delightful patient with head and neck cancer. I *** recommend radiotherapy for this patient.  We discussed the potential risks, benefits, and side effects of radiotherapy. We talked in detail about  acute and late effects. We discussed that some of the most bothersome acute effects may be mucositis, dysgeusia, salivary changes, skin irritation, hair loss, dehydration, weight loss and fatigue. We talked about late effects which include but are not necessarily limited to dysphagia, hypothyroidism, nerve injury, vascular injury, spinal cord injury, xerostomia, trismus, neck edema, dental issues, non-healing wound, and potentially fatal injury to any of the tissues in the head and neck region. No guarantees of treatment were given. A consent form was signed and placed in the patient's medical record. The patient is enthusiastic about proceeding with treatment. I look forward to participating in the patient's care.    Simulation (treatment planning) will take place ***  We also discussed that the treatment of head and neck cancer is a multidisciplinary process to maximize treatment outcomes and quality of life. For this reason the following referrals have been or will be made:  *** Medical oncology to discuss chemotherapy   *** Dentistry for dental evaluation, possible extractions in the radiation fields, and /or advice on reducing risk of cavities, osteoradionecrosis, or other oral issues.  *** Nutritionist for nutrition support during and after treatment.  *** Speech language pathology for swallowing and/or speech therapy.  *** Social work for social support.   *** Physical therapy due to risk of lymphedema in neck and deconditioning.  *** Baseline labs including TSH.  On date of service, in total, I spent *** minutes on this encounter. Patient was seen in person.  __________________________________________   Lauraine Golden, MD  This document serves as a record of services personally performed by Lauraine Golden, MD. It was created on her behalf by Dorthy Fuse, a trained medical scribe. The creation of this record is based on the scribe's personal observations and the provider's statements  to them. This document has been checked and approved by the attending provider.

## 2024-03-14 NOTE — Progress Notes (Signed)
 Oncology Nurse Navigator Documentation   Placed introductory call to new referral patient Mandie Crabbe. Introduced myself as the H&N oncology nurse navigator that works with Dr. Izell to whom she has been referred by Dr. Tobie. She confirmed understanding of referral. Briefly explained my role as her navigator, provided my contact information.  Confirmed understanding of upcoming appts and [facility] location, explained arrival and registration process. I encouraged her to call with questions/concerns as she moves forward with appts and procedures.   She verbalized understanding of information provided, expressed appreciation for my call.   Navigator Initial Assessment Employment Status: she is retired Currently on [employer] / STD: no Living Situation: she lives with her husband at home.  Support System: husband, family PCP: Josette Aho PA PCD: Financial Concerns:no Transportation Needs: no Sensory Deficits:no Language Barriers/Interpreter Needed:  no Ambulation Needs: no Psychosocial Needs:  no Concerns/Needs Understanding Cancer:  addressed/answered by navigator to best of ability Self-Expressed Needs: no   Delon Jefferson RN, BSN, OCN Head & Neck Oncology Nurse Navigator [facility] at [facility] Phone # [phone]  Fax # [phone]

## 2024-03-14 NOTE — Progress Notes (Signed)
 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 Lippard underwent a dental cleaning and a few days later she had some right neck swelling and right mass. Had some cheek swelling and was on a course of antiobiotics  Biopsies revealed:    03/06/2024 PET Scan   02/05/2024 CT Soft Tissue Neck W Contrast     Nutrition Status Yes No Comments  Weight changes? [x]  []  Patient has lost a few pounds over the past month  Swallowing concerns? []  [x]    PEG? []  [x]     Referrals Yes No Comments  Social Work? [x]  []    Dentistry? [x]  []    Swallowing therapy? [x]  []    Nutrition? [x]  []    Med/Onc? [x]  []     Safety Issues Yes No Comments  Prior radiation? []  [x]    Pacemaker/ICD? []  [x]    Possible current pregnancy? []  [x]    Is the patient on methotrexate? []  [x]     Tobacco/Marijuana/Snuff/ETOH use:  Occasional alcohol use, no tobacco use  Past/Anticipated interventions by otolaryngology, if any:  03/11/2024 Tobie, MD    Current Complaints / other details:   None  BP (!) 158/97 (BP Location: Left Arm, Patient Position: Sitting, Cuff Size: Large)   Pulse 84   Temp 97.7 F (36.5 C)   Resp 18   Ht 5' 3 (1.6 m)   Wt 171 lb 3.2 oz (77.7 kg)   SpO2 99%   BMI 30.33 kg/m    Wt Readings from Last 3 Encounters:  03/15/24 171 lb 3.2 oz (77.7 kg)  02/19/24 178 lb (80.7 kg)  02/02/23 189 lb (85.7 kg)

## 2024-03-14 NOTE — Progress Notes (Signed)
 [name] Cordella LABOR, MD  Michaelene Setter PROCEDURE / BIOPSY REVIEW Date: 03/14/24  Requested Biopsy site: right neck lymph node Reason for request: FDG avid neck lymph node.  Core sample Imaging review: Best seen on PET 03-06-24  Decision: Approved Imaging modality to perform: Ultrasound Schedule with: No sedation / Local anesthetic Schedule for: Any VIR  Additional comments: @Schedulers .  Please contact me with questions, concerns, or if issue pertaining to this request arise.  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

## 2024-03-15 ENCOUNTER — Ambulatory Visit
Admission: RE | Admit: 2024-03-15 | Discharge: 2024-03-15 | Disposition: A | Discharge status: Home / Self Care | Payer: PRIVATE HEALTH INSURANCE | Source: Ambulatory Visit | Attending: Radiation Oncology | Admitting: Radiation Oncology

## 2024-03-15 ENCOUNTER — Encounter: Payer: Self-pay | Admitting: Radiation Oncology

## 2024-03-15 VITALS — BP 158/97 | HR 84 | Temp 97.7°F | Resp 18 | Ht 63.0 in | Wt 171.2 lb

## 2024-03-15 DIAGNOSIS — N132 Hydronephrosis with renal and ureteral calculous obstruction: Secondary | ICD-10-CM | POA: Diagnosis not present

## 2024-03-15 DIAGNOSIS — C770 Secondary and unspecified malignant neoplasm of lymph nodes of head, face and neck: Secondary | ICD-10-CM | POA: Insufficient documentation

## 2024-03-15 DIAGNOSIS — E785 Hyperlipidemia, unspecified: Secondary | ICD-10-CM | POA: Diagnosis not present

## 2024-03-15 DIAGNOSIS — R221 Localized swelling, mass and lump, neck: Secondary | ICD-10-CM

## 2024-03-15 DIAGNOSIS — Z7982 Long term (current) use of aspirin: Secondary | ICD-10-CM | POA: Diagnosis not present

## 2024-03-15 DIAGNOSIS — C760 Malignant neoplasm of head, face and neck: Secondary | ICD-10-CM | POA: Insufficient documentation

## 2024-03-15 DIAGNOSIS — M6284 Sarcopenia: Secondary | ICD-10-CM | POA: Insufficient documentation

## 2024-03-15 DIAGNOSIS — I119 Hypertensive heart disease without heart failure: Secondary | ICD-10-CM | POA: Insufficient documentation

## 2024-03-15 DIAGNOSIS — K5730 Diverticulosis of large intestine without perforation or abscess without bleeding: Secondary | ICD-10-CM | POA: Diagnosis not present

## 2024-03-15 DIAGNOSIS — C801 Malignant (primary) neoplasm, unspecified: Secondary | ICD-10-CM | POA: Diagnosis not present

## 2024-03-15 DIAGNOSIS — I700 Atherosclerosis of aorta: Secondary | ICD-10-CM | POA: Insufficient documentation

## 2024-03-15 DIAGNOSIS — Z79899 Other long term (current) drug therapy: Secondary | ICD-10-CM | POA: Insufficient documentation

## 2024-03-15 NOTE — Progress Notes (Signed)
 Oncology Nurse Navigator Documentation   Met with patient during initial consult with [name]. Susan Snow was accompanied by her husband and daughter.  Further introduced myself as her/their Navigator, explained my role as a member of the Care Team. Provided New Patient resource guide binder: Contact information for physicians, this navigator, other members of the Care Team Advance Directive information; provided [facility] AD booklet at their request,  Fall Prevention Patient Safety Plan Financial Assistance Information sheet Symptom Management Clinic information [facility] campus map with highlight of [facility] SLP Information sheet Head and Neck cancer basics Nutrition information Patient and family support information including Spiritual care/Chaplain information, Peer mentor program, health and wellness classes, and the survivorship program Community resources  Provided and discussed educational handouts for PEG and PAC. Assisted with post-consult appt scheduling. They are aware that Susan Snow will receive a call from Dr. Nila office with [facility] dental for consultation and SPD fabrication.   They verbalized understanding of information provided. I encouraged them to call with questions/concerns moving forward.  Delon Jefferson, RN, BSN, OCN Head & Neck Oncology Nurse Navigator [facility] at [place] [phone]

## 2024-03-15 NOTE — Progress Notes (Signed)
 Oncology Nurse Navigator Documentation   I have contacted [facility]ology and requested EBV testing on her 02/21/24 Biopsy. I will follow for results.  Delon Jefferson RN, BSN, OCN Head & Neck Oncology Nurse Navigator [facility] at [facility] Phone # [phone]  Fax # [phone]

## 2024-03-15 NOTE — Telephone Encounter (Signed)
 Done

## 2024-03-15 NOTE — Progress Notes (Signed)
 Dental Form with Estimates of Radiation Dose  Susan Snow Date of birth: 1950-07-12          Diagnosis:    ICD-10-CM   1. Neck mass  R22.1     2. Secondary malignant neoplasm of cervical lymph node (HCC)  C77.0     3. Head and neck cancer (HCC)  C76.0       Cancer Staging  Head and neck cancer (HCC) Staging form: Cervical Lymph Nodes and Unknown Primary Tumors of the Head and Neck, AJCC 8th Edition - Clinical stage from 03/15/2024: Stage III (cT0, cN1, cM0) - Unsigned Stage prefix: Initial diagnosis   Prognosis: curative  Anticipated # of fractions: 30-35    Daily?: yes  # of weeks of radiotherapy: 7  Chemotherapy?: unlikely  Anticipated xerostomia:  Mild permanent   Pre-simulation needs:    Scatter protection   Simulation: ASAP    Other Notes: There is still a possibility that she will undergo surgery instead of radiation.  Also please note that a submental biopsy is pending.  It is unlikely that this mass will be cancerous but if it is cancerous she may receive a dose of >50 Gray adjacent to the anterior right mandibular teeth  Please contact Lauraine Golden, MD, with patient's disposition after evaluation and/or dental treatment. -----------------------------------  Lauraine Golden, MD

## 2024-03-18 ENCOUNTER — Other Ambulatory Visit: Payer: Self-pay | Admitting: Radiology

## 2024-03-21 ENCOUNTER — Ambulatory Visit (HOSPITAL_COMMUNITY)
Admission: RE | Admit: 2024-03-21 | Discharge: 2024-03-21 | Disposition: A | Discharge status: Home / Self Care | Payer: PRIVATE HEALTH INSURANCE | Source: Ambulatory Visit | Attending: Otolaryngology | Admitting: Otolaryngology

## 2024-03-21 DIAGNOSIS — R221 Localized swelling, mass and lump, neck: Secondary | ICD-10-CM | POA: Diagnosis present

## 2024-03-21 DIAGNOSIS — C7989 Secondary malignant neoplasm of other specified sites: Secondary | ICD-10-CM

## 2024-03-21 HISTORY — PX: IR US GUIDE BX ASP/DRAIN: IMG2392

## 2024-03-21 MED ORDER — LIDOCAINE HCL 1 % IJ SOLN
INTRAMUSCULAR | Status: AC
  Filled 2024-03-21: qty 20

## 2024-03-21 MED ORDER — LIDOCAINE HCL 1 % IJ SOLN
2.0000 mL | Freq: Once | INTRAMUSCULAR | Status: AC
  Administered 2024-03-21: 2 mL via INTRADERMAL

## 2024-03-21 NOTE — Procedures (Signed)
 Interventional Radiology Procedure Note  Procedure: US  Guided Biopsy of right submental mass  Complications: None  Estimated Blood Loss: < 10 mL  Findings: Asymmetric hypodense mass in the right submental region. FNA performed 4 passes.  FNA samples obtained and sent to Pathology.  Cordella DELENA Banner, MD

## 2024-03-22 LAB — CYTOLOGY - NON PAP

## 2024-03-26 ENCOUNTER — Encounter (HOSPITAL_COMMUNITY): Payer: Self-pay | Admitting: *Deleted

## 2024-03-26 ENCOUNTER — Ambulatory Visit: Payer: PRIVATE HEALTH INSURANCE | Admitting: Nurse Practitioner

## 2024-03-26 ENCOUNTER — Other Ambulatory Visit: Payer: Self-pay

## 2024-03-26 ENCOUNTER — Encounter: Payer: Self-pay | Admitting: Nurse Practitioner

## 2024-03-26 VITALS — BP 162/98 | HR 94 | Temp 97.6°F

## 2024-03-26 DIAGNOSIS — I10 Essential (primary) hypertension: Secondary | ICD-10-CM

## 2024-03-26 DIAGNOSIS — Z789 Other specified health status: Secondary | ICD-10-CM

## 2024-03-26 NOTE — Progress Notes (Signed)
 Occupational [facility]- [facility]  Subjective:  Patient ID: Susan Snow, female    DOB: 18-Jun-1951  Age: 73 y.o. MRN: [id number]  CC: wellness exam   HPI Mashal Slavick presents for wellness exam visit for insurance benefit.  Patient has a PCP: Josette Aho PA PMH significant for: HTN, recent diagnosis of head/neck cancer Last labs per PCP were completed: July 2025  Health Maintenance:  Colonoscopy: completed 2023 Mammogram:up to date, June 2025 Pap: Spring 2025    Smoker: never  Immunizations:  Shingrix- completed  Flu- yearly Tdap- reports up to date.    Lifestyle: Diet-no particular diet  Exercise- none  Endorse increase stress related to new onset neck cancer diagnosis. Has procedure with ENT scheduled for tomorrow.   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   Hyperlipidemia    Hypertension    Neck mass 12/2023   right side neck   Pneumonia    x 1   Vitamin D deficiency     Past Surgical History:  Procedure Laterality Date   BREAST EXCISIONAL BIOPSY Right 1978   COLONOSCOPY  10/2021   in [place]   IR US  GUIDE BX ASP/DRAIN  03/21/2024   LAPAROSCOPY     x 2 for endometriosis   REVERSE SHOULDER ARTHROPLASTY Left 05/29/2019   Procedure: REVERSE SHOULDER ARTHROPLASTY;  Surgeon: Cristy Bonner DASEN, MD;  Location: [facility];  Service: Orthopedics;  Laterality: Left;   right shoulder arthroscopy Right    TMJ ARTHROSCOPY      Outpatient Medications Prior to Visit  Medication Sig Dispense Refill   aspirin EC 81 MG tablet Take 81 mg by mouth every morning.      Calcium Carb-Cholecalciferol 600-5 MG-MCG TABS Take 1 tablet by mouth daily.     Multiple Vitamins-Minerals (ADULT ONE DAILY GUMMIES) CHEW Chew 2 tablets by mouth every morning.     olmesartan  (BENICAR ) 20 MG tablet Take 20 mg by mouth every morning.   3   simvastatin (ZOCOR) 40 MG tablet Take 40 mg by mouth at  bedtime.   3   Vitamin D, Ergocalciferol, (DRISDOL) 1.25 MG (50000 UNIT) CAPS capsule Take 50,000 Units by mouth once a week.     No facility-administered medications prior to visit.    ROS Review of Systems  HENT:  Negative for ear pain and trouble swallowing.   Eyes:  Negative for visual disturbance.  Respiratory:  Negative for shortness of breath.   Cardiovascular:  Negative for chest pain.  Gastrointestinal:  Negative for constipation, diarrhea, nausea and vomiting.  Musculoskeletal:  Negative for arthralgias and back pain.  Neurological:  Negative for dizziness and headaches.  Psychiatric/Behavioral:  Negative for sleep disturbance.     Objective:  BP (!) 162/98   Pulse 94   Temp 97.6 F (36.4 C) (Temporal)   SpO2 98%   Physical Exam   Assessment & Plan:    Casandra was seen today for wellness exam.  Diagnoses and all orders for this visit:  Participant in health and wellness plan Adult wellness physical was conducted today. Importance of diet and exercise were discussed in detail.  We reviewed immunizations and gave recommendations regarding current immunization needed for age.  Preventative health exams are up to date.   Patient was advised yearly wellness exam and follow-up with PCP as scheduled.   Primary hypertension BP is elevated today, likely transient due to recent  stressors and anxiety related to tomorrow's procedure. She is asymptomatic at this time. Discussed BP goals and encouraged monitoring readings at home. Encouraged to follow-up if BP continues to remain elevated .   No orders of the defined types were placed in this encounter.   No orders of the defined types were placed in this encounter.   Follow-up: as needed

## 2024-03-26 NOTE — Progress Notes (Signed)
 PCP - Josette Aho, PA-C Cardiologist - none  Chest x-ray - n/a EKG - DOS Stress Test - n/a ECHO - n/a Cardiac Cath - n/a  ICD Pacemaker/Loop - n/a  Sleep Study -  n/a  Diabetes - n/a  Blood Thinner Instructions:  n/a  Aspirin Instructions: Last dose was on 03/11/24  ERAS - clears til 10 am DOS  Anesthesia review: no  STOP now taking any Aspirin (unless otherwise instructed by your surgeon), Aleve , Naproxen , Ibuprofen, Motrin, Advil, Goody's, BC's, all herbal medications, fish oil, and all vitamins.   Coronavirus Screening Do you have any of the following symptoms:  Cough yes/no: No Fever (>100.7F)  yes/no: No Runny nose yes/no: No Sore throat yes/no: No Difficulty breathing/shortness of breath  yes/no: No  Have you traveled in the last 14 days and where? yes/no: No  Patient verbalized understanding of instructions that were given via phone.

## 2024-03-27 ENCOUNTER — Other Ambulatory Visit: Payer: Self-pay

## 2024-03-27 ENCOUNTER — Encounter (HOSPITAL_COMMUNITY): Payer: Self-pay

## 2024-03-27 ENCOUNTER — Ambulatory Visit (HOSPITAL_COMMUNITY): Payer: PRIVATE HEALTH INSURANCE | Admitting: Anesthesiology

## 2024-03-27 ENCOUNTER — Ambulatory Visit (HOSPITAL_BASED_OUTPATIENT_CLINIC_OR_DEPARTMENT_OTHER): Payer: PRIVATE HEALTH INSURANCE | Admitting: Anesthesiology

## 2024-03-27 ENCOUNTER — Ambulatory Visit (HOSPITAL_COMMUNITY)
Admission: RE | Admit: 2024-03-27 | Discharge: 2024-03-27 | Disposition: A | Discharge status: Home / Self Care | Payer: PRIVATE HEALTH INSURANCE | Attending: Otolaryngology | Admitting: Otolaryngology

## 2024-03-27 ENCOUNTER — Encounter (HOSPITAL_COMMUNITY)
Admission: RE | Disposition: A | Discharge status: Home / Self Care | Payer: Self-pay | Source: Home / Self Care | Attending: Otolaryngology

## 2024-03-27 DIAGNOSIS — I10 Essential (primary) hypertension: Secondary | ICD-10-CM | POA: Diagnosis not present

## 2024-03-27 DIAGNOSIS — C098 Malignant neoplasm of overlapping sites of tonsil: Secondary | ICD-10-CM

## 2024-03-27 DIAGNOSIS — C7989 Secondary malignant neoplasm of other specified sites: Secondary | ICD-10-CM | POA: Diagnosis not present

## 2024-03-27 DIAGNOSIS — D101 Benign neoplasm of tongue: Secondary | ICD-10-CM

## 2024-03-27 DIAGNOSIS — Z79899 Other long term (current) drug therapy: Secondary | ICD-10-CM | POA: Diagnosis not present

## 2024-03-27 DIAGNOSIS — C4492 Squamous cell carcinoma of skin, unspecified: Secondary | ICD-10-CM

## 2024-03-27 DIAGNOSIS — C4442 Squamous cell carcinoma of skin of scalp and neck: Secondary | ICD-10-CM

## 2024-03-27 DIAGNOSIS — C760 Malignant neoplasm of head, face and neck: Secondary | ICD-10-CM | POA: Diagnosis not present

## 2024-03-27 HISTORY — DX: Unspecified osteoarthritis, unspecified site: M19.90

## 2024-03-27 HISTORY — PX: TONSILLECTOMY: SHX5217

## 2024-03-27 HISTORY — PX: DIRECT LARYNGOSCOPY: SHX5326

## 2024-03-27 HISTORY — DX: Vitamin D deficiency, unspecified: E55.9

## 2024-03-27 HISTORY — DX: Pneumonia, unspecified organism: J18.9

## 2024-03-27 LAB — BASIC METABOLIC PANEL WITH GFR
Anion gap: 12 (ref 5–15)
BUN: 9 mg/dL (ref 8–23)
CO2: 23 mmol/L (ref 22–32)
Calcium: 9.3 mg/dL (ref 8.9–10.3)
Chloride: 106 mmol/L (ref 98–111)
Creatinine, Ser: 0.68 mg/dL (ref 0.44–1.00)
GFR, Estimated: >60 mL/min (ref >60)
Glucose, Bld: 112 mg/dL — ABNORMAL HIGH (ref 70–99)
Potassium: 3.7 mmol/L (ref 3.5–5.1)
Sodium: 141 mmol/L (ref 135–145)

## 2024-03-27 LAB — CBC
HCT: 44.2 % (ref 36.0–46.0)
Hemoglobin: 14.8 g/dL (ref 12.0–15.0)
MCH: 32.7 pg (ref 26.0–34.0)
MCHC: 33.5 g/dL (ref 30.0–36.0)
MCV: 97.8 fL (ref 80.0–100.0)
Platelets: 290 K/uL (ref 150–400)
RBC: 4.52 MIL/uL (ref 3.87–5.11)
RDW: 11.2 % — ABNORMAL LOW (ref 11.5–15.5)
WBC: 7.5 K/uL (ref 4.0–10.5)
nRBC: 0 % (ref 0.0–0.2)

## 2024-03-27 SURGERY — LARYNGOSCOPY, DIRECT
Anesthesia: General | Site: Throat | Laterality: Right

## 2024-03-27 MED ORDER — IBUPROFEN 200 MG PO TABS: 400.0000 mg | ORAL_TABLET | Freq: Four times a day (QID) | ORAL | 2 refills | Status: DC

## 2024-03-27 MED ORDER — OXYCODONE HCL 5 MG PO TABS: 5.0000 mg | ORAL_TABLET | ORAL | 0 refills | Status: AC | PRN

## 2024-03-27 MED ORDER — FENTANYL CITRATE (PF) 250 MCG/5ML IJ SOLN
INTRAMUSCULAR | Status: DC | PRN
  Administered 2024-03-27 (×3): 50 ug via INTRAVENOUS

## 2024-03-27 MED ORDER — 0.9 % SODIUM CHLORIDE (POUR BTL) OPTIME
TOPICAL | Status: DC | PRN
  Administered 2024-03-27: 1000 mL

## 2024-03-27 MED ORDER — OXYMETAZOLINE HCL 0.05 % NA SOLN
NASAL | Status: DC | PRN
  Administered 2024-03-27: 2 via TOPICAL

## 2024-03-27 MED ORDER — FENTANYL CITRATE (PF) 250 MCG/5ML IJ SOLN
INTRAMUSCULAR | Status: AC
  Filled 2024-03-27: qty 5

## 2024-03-27 MED ORDER — DEXAMETHASONE SODIUM PHOSPHATE 10 MG/ML IJ SOLN
INTRAMUSCULAR | Status: DC | PRN
  Administered 2024-03-27: 10 mg via INTRAVENOUS

## 2024-03-27 MED ORDER — LIDOCAINE 2% (20 MG/ML) 5 ML SYRINGE
INTRAMUSCULAR | Status: DC | PRN
  Administered 2024-03-27: 60 mg via INTRAVENOUS

## 2024-03-27 MED ORDER — PROPOFOL 10 MG/ML IV BOLUS
INTRAVENOUS | Status: DC | PRN
  Administered 2024-03-27: 150 mg via INTRAVENOUS

## 2024-03-27 MED ORDER — ORAL CARE MOUTH RINSE: 15.0000 mL | Freq: Once | OROMUCOSAL | Status: AC

## 2024-03-27 MED ORDER — DEXAMETHASONE SODIUM PHOSPHATE 10 MG/ML IJ SOLN
INTRAMUSCULAR | Status: AC
  Filled 2024-03-27: qty 1

## 2024-03-27 MED ORDER — ROCURONIUM BROMIDE 10 MG/ML (PF) SYRINGE
PREFILLED_SYRINGE | INTRAVENOUS | Status: DC | PRN
  Administered 2024-03-27: 20 mg via INTRAVENOUS
  Administered 2024-03-27: 40 mg via INTRAVENOUS

## 2024-03-27 MED ORDER — ACETAMINOPHEN 10 MG/ML IV SOLN
INTRAVENOUS | Status: DC | PRN
Start: 2024-03-27 — End: 2024-03-27
  Administered 2024-03-27: 1000 mg via INTRAVENOUS

## 2024-03-27 MED ORDER — DEXMEDETOMIDINE HCL IN NACL 80 MCG/20ML IV SOLN
INTRAVENOUS | Status: DC | PRN
  Administered 2024-03-27 (×2): 8 ug via INTRAVENOUS

## 2024-03-27 MED ORDER — ACETAMINOPHEN 10 MG/ML IV SOLN
INTRAVENOUS | Status: AC
  Filled 2024-03-27: qty 100

## 2024-03-27 MED ORDER — AMISULPRIDE (ANTIEMETIC) 5 MG/2ML IV SOLN: 10.0000 mg | Freq: Once | INTRAVENOUS | Status: DC | PRN

## 2024-03-27 MED ORDER — LACTATED RINGERS IV SOLN: INTRAVENOUS | Status: DC

## 2024-03-27 MED ORDER — FENTANYL CITRATE (PF) 100 MCG/2ML IJ SOLN: 25.0000 ug | INTRAMUSCULAR | Status: DC | PRN

## 2024-03-27 MED ORDER — ONDANSETRON HCL 4 MG/2ML IJ SOLN
INTRAMUSCULAR | Status: DC | PRN
Start: 2024-03-27 — End: 2024-03-27
  Administered 2024-03-27: 4 mg via INTRAVENOUS

## 2024-03-27 MED ORDER — ONDANSETRON HCL 4 MG/2ML IJ SOLN
INTRAMUSCULAR | Status: AC
  Filled 2024-03-27: qty 2

## 2024-03-27 MED ORDER — OXYMETAZOLINE HCL 0.05 % NA SOLN
NASAL | Status: AC
  Filled 2024-03-27: qty 30

## 2024-03-27 MED ORDER — MIDAZOLAM HCL 2 MG/2ML IJ SOLN
INTRAMUSCULAR | Status: AC
Start: 2024-03-27 — End: 2024-03-27
  Filled 2024-03-27: qty 2

## 2024-03-27 MED ORDER — OXYCODONE HCL 5 MG/5ML PO SOLN: 5.0000 mg | Freq: Once | ORAL | Status: DC | PRN

## 2024-03-27 MED ORDER — SUGAMMADEX SODIUM 200 MG/2ML IV SOLN
INTRAVENOUS | Status: DC | PRN
  Administered 2024-03-27: 200 mg via INTRAVENOUS

## 2024-03-27 MED ORDER — OXYCODONE HCL 5 MG PO TABS: 5.0000 mg | ORAL_TABLET | Freq: Once | ORAL | Status: DC | PRN

## 2024-03-27 MED ORDER — ACETAMINOPHEN 500 MG PO TABS: 1000.0000 mg | ORAL_TABLET | Freq: Four times a day (QID) | ORAL | 2 refills | Status: AC

## 2024-03-27 MED ORDER — ROCURONIUM BROMIDE 10 MG/ML (PF) SYRINGE
PREFILLED_SYRINGE | INTRAVENOUS | Status: AC
  Filled 2024-03-27: qty 10

## 2024-03-27 MED ORDER — MIDAZOLAM HCL 2 MG/2ML IJ SOLN
INTRAMUSCULAR | Status: DC | PRN
  Administered 2024-03-27: 2 mg via INTRAVENOUS

## 2024-03-27 MED ORDER — CHLORHEXIDINE GLUCONATE 0.12 % MT SOLN
15.0000 mL | Freq: Once | OROMUCOSAL | Status: AC
  Administered 2024-03-27: 15 mL via OROMUCOSAL
  Filled 2024-03-27: qty 15

## 2024-03-27 SURGICAL SUPPLY — 30 items
CANISTER SUCTION 3000ML PPV (SUCTIONS) ×3 IMPLANT
CATH ROBINSON RED A/P 10FR (CATHETERS) IMPLANT
CLEANER TIP ELECTROSURG 2X2 (MISCELLANEOUS) ×3 IMPLANT
CNTNR URN SCR LID CUP LEK RST (MISCELLANEOUS) IMPLANT
COAGULATOR SUCT SWTCH 10FR 6 (ELECTROSURGICAL) ×3 IMPLANT
COVER BACK TABLE 60X90IN (DRAPES) ×3 IMPLANT
COVER MAYO STAND STRL (DRAPES) ×3 IMPLANT
DRAPE HALF SHEET 40X57 (DRAPES) ×3 IMPLANT
ELECT COATED BLADE 2.86 ST (ELECTRODE) ×3 IMPLANT
ELECTRODE REM PT RTRN 9FT ADLT (ELECTROSURGICAL) IMPLANT
GAUZE 4X4 16PLY ~~LOC~~+RFID DBL (SPONGE) ×3 IMPLANT
GLOVE BIO SURGEON STRL SZ 6.5 (GLOVE) ×3 IMPLANT
GLOVE BIO SURGEON STRL SZ7.5 (GLOVE) ×3 IMPLANT
GUARD TEETH (MISCELLANEOUS) ×3 IMPLANT
KIT BASIN OR (CUSTOM PROCEDURE TRAY) ×3 IMPLANT
KIT TURNOVER KIT B (KITS) ×3 IMPLANT
PACK SRG BSC III STRL LF ECLPS (CUSTOM PROCEDURE TRAY) ×3 IMPLANT
PAD ARMBOARD POSITIONER FOAM (MISCELLANEOUS) ×6 IMPLANT
PATTIES SURGICAL .5X1.5 (GAUZE/BANDAGES/DRESSINGS) ×3 IMPLANT
PENCIL SMOKE EVACUATOR (MISCELLANEOUS) ×3 IMPLANT
POSITIONER HEAD DONUT 9IN (MISCELLANEOUS) ×3 IMPLANT
SOL ANTI FOG 6CC (MISCELLANEOUS) IMPLANT
SOLN 0.9% NACL 1000 ML (IV SOLUTION) ×2 IMPLANT
SOLN 0.9% NACL POUR BTL 1000ML (IV SOLUTION) ×3 IMPLANT
SPONGE TONSIL 1.25 RF SGL STRG (GAUZE/BANDAGES/DRESSINGS) ×3 IMPLANT
SYR BULB EAR ULCER 3OZ GRN STR (SYRINGE) ×3 IMPLANT
TOWEL GREEN STERILE FF (TOWEL DISPOSABLE) ×6 IMPLANT
TUBE CONNECTING 12X1/4 (SUCTIONS) ×3 IMPLANT
TUBE SALEM SUMP 16F (TUBING) ×3 IMPLANT
YANKAUER SUCT BULB TIP NO VENT (SUCTIONS) ×3 IMPLANT

## 2024-03-27 NOTE — Discharge Instructions (Addendum)
 Post Anesthesia Guidelines  During recovery from anesthesia  You may feel drowsy and reflexes may be slowed for 24 hours:  -Do not drive, use machinery, appliances, ride bicycles or scooters  -Do not consume alcohol  -Do not make important decisions   Eating and drinking:  -Drink plenty of liquids today  -Avoid fried or spicy foods today  -Return to your regular diet slowly over the next 24 hours  -Please call if unable to keep fluids down  Surgery Discharge Instructions:  Call clinic or return to ED if you: - develop a fever greater than 101.4 - have shaking chills or are feeling ill - become short of breath - have uncontrollable nausea or vomiting - can't hold down food or liquids or feel as though you are getting dehydrated - any other acute events, problems, or concerns  Wound Care/Dressings/Drain Instructions:  - Eat soft foods for next few days - Your tongue and throat will be sore for up to next week, you can take pain medication as needed - Some amount of blood tinged drainage is expected after surgery from mouth or nose. This should stop over next 48-72 hours. If bleeding is profuse, please call us  or go to ER.   Medications: - Resume your regular home medications except as detailed in the medication reconciliation. Resume your aspirin 81mg  in 1 week. - For pain, take tylenol  1000mg  every 6 hours alternating with Ibuprofen 400mg  every 6 hours. If that is not sufficient, a stronger pain medication has been prescribed to you (oxycodone  5mg  every 4-6 hours as needed). Do not mix with any other narcotic medication.  Follow Up:  - A follow up appointment should be scheduled for you after discharge. Please call the office if you do not know the exact date and time  Activity/Restrictions:  - Resume your regular activities, as tolerated. Avoid heavy lifting or straining (more than 10 lbs) for 7 days.  Diet: - Eat soft foods for next few days, then advance your diet to as  tolerated  Additional Instructions: - Please take an over the counter stool softener while taking narcotic pain medication - DO NOT MIX NARCOTIC PAIN MEDICATIONS OR TAKE NARCOTIC PRESCRIPTIONS AT THE SAME TIME - DO NOT DRIVE OR OPERATE HEAVY MACHINERY WHILE ON NARCOTICS  - DO NOT TAKE MORE THAN 4 GRAMS (4000mg ) OF TYLENOL  (ACETAMINOPHEN ) IN 24 HOURS

## 2024-03-27 NOTE — H&P (Signed)
 Pre-Operative H&P - Day Of Surgery Patient Name: Susan Snow Date:   03/27/2024  HPI: Susan Snow is a 73 y.o. female who presents today for operative treatment of squamous cell carcinoma of unknown primary. Patient denies recent significant changes to health or significant new medications or physiologic change in condition which would immediately impact plans. No new types of therapy has been initiated that would change the plan or the appropriateness of the plan.   ROS:  A complete review of systems was obtained and is otherwise negative.   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   Hyperlipidemia    Hypertension    Neck mass 12/2023   right side neck   Pneumonia    x 1   Vitamin D deficiency     PSH:  Past Surgical History:  Procedure Laterality Date   BREAST EXCISIONAL BIOPSY Right 1978   COLONOSCOPY  10/2021   in [place]   IR US  GUIDE BX ASP/DRAIN  03/21/2024   LAPAROSCOPY     x 2 for endometriosis   REVERSE SHOULDER ARTHROPLASTY Left 05/29/2019   Procedure: REVERSE SHOULDER ARTHROPLASTY;  Surgeon: Cristy Bonner DASEN, MD;  Location: [facility];  Service: Orthopedics;  Laterality: Left;   right shoulder arthroscopy Right    TMJ ARTHROSCOPY      MEDS:   Current Facility-Administered Medications:    lactated ringers  infusion, , Intravenous, Continuous, Epifanio Charleston, MD, Last Rate: 10 mL/hr at 03/27/24 1244, Continued from Pre-op at 03/27/24 1244  ALLERGIES: Olmesartan   EXAM: Vitals: BP (!) 147/90   Pulse 80   Temp (!) 97.4 F (36.3 C)   Resp 16   Ht 5' 3 (1.6 m)   Wt 77.6 kg   SpO2 94%   BMI 30.29 kg/m   General Awake, at baseline alertness.   HEENT No scleral icterus or conjunctival hemorrhage. Globe position appears normal. External ears  normal. Nose patent without rhinorrhea. No lymphadenopathy. No thyromegaly  Cardiovascular No cyanosis.  Pulmonary No  audible stridor. Breathing easily with no labor.  Neuro Symmetric facial movement.   Psychiatry Appropriate affect and mood.  Skin No scars or lesions on face or neck.  Extermities Moves all extremities with normal range of motion.   Other Findings None.   Assessment & Plan: Susan Snow has diagnoses of squamous cell carcinoma of unknown primary and will go to the OR today for direct laryngoscopy with biopsy, possible bilateral tonsillectomy. Informed consent was obtained and available in EMR today. All questions have been answered, and risks/benefits/alternatives of procedure as noted in the consent were discussed in a quiet area. Questions were invited and answered. The patient expressed understanding, provided consent and wished to proceed despite risks.  Susan Snow Susan Snow 03/27/2024 12:45 PM

## 2024-03-27 NOTE — Anesthesia Preprocedure Evaluation (Addendum)
 Anesthesia Evaluation  Patient identified by MRN, date of birth, ID band Patient awake    Reviewed: Allergy & Precautions, NPO status , Patient's Chart, lab work & pertinent test results  Airway Mallampati: II  TM Distance: >3 FB Neck ROM: Full    Dental  (+) Dental Advisory Given   Pulmonary neg pulmonary ROS   breath sounds clear to auscultation       Cardiovascular hypertension, Pt. on medications  Rhythm:Regular Rate:Normal     Neuro/Psych negative neurological ROS     GI/Hepatic negative GI ROS, Neg liver ROS,,,  Endo/Other  negative endocrine ROS    Renal/GU negative Renal ROS     Musculoskeletal  (+) Arthritis ,    Abdominal   Peds  Hematology negative hematology ROS (+)   Anesthesia Other Findings SCC head and neck  Reproductive/Obstetrics                              Anesthesia Physical Anesthesia Plan  ASA: 2  Anesthesia Plan: General   Post-op Pain Management: Ofirmev  IV (intra-op)* and Toradol IV (intra-op)*   Induction: Intravenous  PONV Risk Score and Plan: 3 and Dexamethasone , Ondansetron  and Treatment may vary due to age or medical condition  Airway Management Planned: Oral ETT  Additional Equipment:   Intra-op Plan:   Post-operative Plan: Extubation in OR  Informed Consent: I have reviewed the patients History and Physical, chart, labs and discussed the procedure including the risks, benefits and alternatives for the proposed anesthesia with the patient or authorized representative who has indicated his/her understanding and acceptance.     Dental advisory given  Plan Discussed with: CRNA  Anesthesia Plan Comments:         Anesthesia Quick Evaluation

## 2024-03-27 NOTE — Op Note (Addendum)
 Otolaryngology Operative note  [name] Date/Time of Admission: 03/27/2024 10:16 AM  CSN: [id number];FMW:[id number]  DOB: 11/24/1950 Age: 73 y.o. Location: [facility]    Pre-Op Diagnosis: Squamous Cell Carcinoma of Head and Neck of Unknown Primary  Post-Op Diagnosis: Same  Procedure: Suspension Microdirect Laryngoscopy with biopsy with use of Operating Telescope (CPT [phone])  Surgeon: Eldora Blanch, MD  Anesthesia type:  General  Anesthesiologist: Anesthesiologist: Epifanio Charleston, MD CRNA: Julien Manus, CRNA Student Nurse Anesthetist: Thomasina Laurence GRADE, RN   Staff: See Log  Implants: None  Specimens: Right glossotonsillar sulcus - frozen Right glossontonsillar sulcus - permanent Right lingual tonsil - permanent  EBL:  15 mL  Drains:   Post-op disposition and condition: PACU, hemodynamically stable  Findings: Small area in the glossotonsillar sulcus (perhaps 1cm) which was slightly polypoid and firm, which was biosied -- returned as carcinoma on frozen section. More tissue taken and sent for permanent Small area over adjacent right lingual tonsil which was firm, biopsied and sent for permanent pathology No other areas with large masses noted including over tonsillar fossa   Complications: None apparent  Indications and consent:  Susan Snow is a 73 y.o. female with SCC of head and neck with unknown primary. The patient's options were discussed, including risks/benefits/alternatives for each option. Patient expressed understanding, and despite these risks, consented and decided to proceed with above procedures. Informed consent was signed before proceeding.  Procedure: The patient was identified in the preoperative area, consent confirmed, transported to the operating suite. They were transferred to the operating room table and placed in a supine position. After induction of general endotracheal anesthesia and establishment of airway, a surgeon initiated  time out was performed.  The bed was rotated 90 degrees. The patient was then padded and draped appropriately.     The oral cavity was first examined and no abnormalities were identified. We then proceeded with our direct laryngoscopy. A mouth guard was placed to protect the maxillary gingiva. The Dedo laryngoscope was inserted into the oral cavity and advanced distally. Examination of the oral cavity, oropharynx, hypopharynx and larynx was performed in a sequential manner at that time. It revealed findings above. The operating telescope (0 degree) was used to examine the areas described above. Cup forceps were used to obtain biopsies and specimen passed off the field over glossotonsillar sulcus. Frozen section showed neoplastic process/carcinoma. Additional biopsies of the area were taken, and adjacent area over right lingual tonsil was also biopsied. Hemostasis was achieved with Afrin pledgets. The hypopharynx and oropharynx were suctioned. All instruments were removed.  This concluded our procedure. The patient tolerated the procedure well without any apparent immediate post operative complications.  The patient was rotated back to their original position, gently awakened from general anesthesia and taken to the PACU in stable condition. I was present and participated through the entirety of the procedure.   Follow up: As scheduled

## 2024-03-27 NOTE — Transfer of Care (Signed)
 Immediate Anesthesia Transfer of Care Note  Patient: Susan Snow  Procedure(s) Performed: LARYNGOSCOPY, DIRECT (Right) TONSILLECTOMY (Bilateral)  Patient Location: PACU  Anesthesia Type:General  Level of Consciousness: awake  Airway & Oxygen Therapy: Patient Spontanous Breathing and Patient connected to face mask  Post-op Assessment: Report given to RN and Post -op Vital signs reviewed and stable  Post vital signs: Reviewed and stable  Last Vitals:  Vitals Value Taken Time  BP 162/74 03/27/24 14:18  Temp 36.4 C 03/27/24 14:18  Pulse 85 03/27/24 14:19  Resp 23 03/27/24 14:19  SpO2 99 % 03/27/24 14:19  Vitals shown include unfiled device data.  Last Pain:  Vitals:   03/27/24 1048  PainSc: 0-No pain      Patients Stated Pain Goal: 0 (03/27/24 1048)  Complications: No notable events documented.

## 2024-03-27 NOTE — Anesthesia Procedure Notes (Signed)
 Procedure Name: Intubation Date/Time: 03/27/2024 1:20 PM  Performed by: Thomasina Laurence GRADE, RNPre-anesthesia Checklist: Patient identified, Emergency Drugs available, Suction available and Patient being monitored Patient Re-evaluated:Patient Re-evaluated prior to induction Oxygen Delivery Method: Circle System Utilized Preoxygenation: Pre-oxygenation with 100% oxygen Induction Type: IV induction Ventilation: Mask ventilation without difficulty Laryngoscope Size: 3 and Mac Grade View: Grade I Tube type: Oral Tube size: 6.0 mm Number of attempts: 1 Airway Equipment and Method: Stylet and Oral airway Placement Confirmation: ETT inserted through vocal cords under direct vision, positive ETCO2 and breath sounds checked- equal and bilateral Secured at: 23 cm Tube secured with: Tape Dental Injury: Teeth and Oropharynx as per pre-operative assessment

## 2024-03-28 ENCOUNTER — Encounter (HOSPITAL_COMMUNITY): Payer: Self-pay | Admitting: Otolaryngology

## 2024-04-01 LAB — SURGICAL PATHOLOGY

## 2024-04-02 NOTE — Progress Notes (Signed)
 Dear Dr. Debrah, Here is my assessment for our mutual patient, Susan Snow. Thank you for allowing me the opportunity to care for your patient. Please do not hesitate to contact me should you have any other questions. Sincerely, Dr. Eldora Blanch  Otolaryngology Clinic Note Referring provider: Dr. Debrah HPI:  Susan Snow is a 73 y.o. female kindly referred by Dr. Debrah for evaluation of right neck mass and swelling  Initial visit (01/2024): She reports that she underwent a dental cleaning and a few days later, she had some right cheek swelling (happened suddenly). During this, she did not have any pain or redness, just the swelling. Went to ED, noted parotitis and treated for his with antibiotics with resolution. In the process, she was noted to have a right Level 2 LN with suspicion for necrosis and it was persistent on repeat CT and sent here. No neck pain at the time. No skin cancers Patient currently reports no symptoms H&N wise as below. Swelling has completely resolved. No neck pain. No throat mass Patient otherwise denies: - dysphagia, odynophagia, unintentional weight loss - changes in voice, shortness of breath, hemoptysis tobacco or significant alcohol history - ear pain, neck masses  --------------------------------------------------------- 03/11/2024 Seen in f/u. We had a long discussion regarding options and her PET and biopsy. She continues to fairly well from symptom standpoint.  --------------------------------------------------------- 03/13/2024 The patient gave consent to have this visit done by telemedicine / virtual visit, two identifiers were used to identify patient. This is also consent for access the chart and treat the patient via this visit. The patient is located in  .  I, the provider, am at the office.  We spent 20 minutes together for the visit.  Joined by telephone  Discussed after tumor board, recommend at least will have some form of  radiation most likely. We discussed options and appears most reasonable to do DL/Bx first to find possible lesion/primary.   ENT Surgery: TMJ surgery (40 years ago) Personal or FHx of bleeding dz or anesthesia difficulty: no  GLP-1: no AP/AC: ASA 81  Tobacco: no  PMHx: HTN, HLD  Independent Review of Additional Tests or Records:  Dr. 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; slight right parotid increased density but no significant surrounding cellulitis or stranding, stable in size, some asymmetry right GT sulcus; medialized carotid R>L; CT Neck 01/14/2024 - some right parotid/cheek stranding, stable neck mass size Path 02/21/2024: SCCa, p16+ PET CT independently interpreted (03/06/2024): right PET avid LN, with some PET avidity right GT sulcus and tonsil area (slightly more compared to left). No other noted areas in H&N of suspicious PET avidity. 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   Hyperlipidemia    Hypertension    Neck mass 12/2023   right side neck   Pneumonia    x 1   Vitamin D deficiency      Past Surgical History:  Procedure Laterality Date   BREAST EXCISIONAL BIOPSY Right 1978   COLONOSCOPY  10/2021   in [place]   DIRECT LARYNGOSCOPY Bilateral 03/27/2024   Procedure: LARYNGOSCOPY, DIRECT;  Surgeon: Blanch Eldora NOVAK, MD;  Location: [facility] OR;  Service: ENT;  Laterality: Bilateral;   IR US  GUIDE BX ASP/DRAIN  03/21/2024   LAPAROSCOPY     x 2 for endometriosis   REVERSE SHOULDER ARTHROPLASTY Left 05/29/2019   Procedure: REVERSE SHOULDER ARTHROPLASTY;  Surgeon: Cristy Bonner DASEN, MD;  Location: [facility];   Service: Orthopedics;  Laterality: Left;   right shoulder arthroscopy Right    TMJ ARTHROSCOPY     TONSILLECTOMY Right 03/27/2024   Procedure: TONSILLECTOMY, PARTIAL;  Surgeon: [name], MD;  Location: [facility] OR;  Service: ENT;  Laterality: Right;    Family History  Problem Relation Age of Onset   Hypertension Mother    Heart attack Father 67   Fibromyalgia Sister      Social Connections: Unknown (05/27/2019)   Social Connection and Isolation Panel    Frequency of Communication with Friends and Family: Patient declined    Frequency of Social Gatherings with Friends and Family: Patient declined    Attends Religious Services: Patient declined    Database administrator or Organizations: Patient declined    Attends Banker Meetings: Patient declined    Marital Status: Patient declined      Current Outpatient Medications:    acetaminophen  (TYLENOL ) 500 MG tablet, Take 2 tablets (1,000 mg total) by mouth every 6 (six) hours., Disp: 100 tablet, Rfl: 2   [START ON 04/04/2024] aspirin EC 81 MG tablet, Take 1 tablet (81 mg total) by mouth every morning., Disp: , Rfl:    Calcium Carb-Cholecalciferol 600-5 MG-MCG TABS, Take 1 tablet by mouth daily., Disp: , Rfl:    ibuprofen (MOTRIN IB) 200 MG tablet, Take 2 tablets (400 mg total) by mouth every 6 (six) hours., Disp: 100 tablet, Rfl: 2   Multiple Vitamins-Minerals (ADULT ONE DAILY GUMMIES) CHEW, Chew 2 tablets by mouth every morning., Disp: , Rfl:    olmesartan  (BENICAR ) 20 MG tablet, Take 20 mg by mouth every morning. , Disp: , Rfl: 3   oxyCODONE  (ROXICODONE ) 5 MG immediate release tablet, Take 1 tablet (5 mg total) by mouth every 4 (four) hours as needed for up to 7 days., Disp: 25 tablet, Rfl: 0   simvastatin (ZOCOR) 40 MG tablet, Take 40 mg by mouth at bedtime. , Disp: , Rfl: 3   Vitamin D, Ergocalciferol, (DRISDOL) 1.25 MG (50000 UNIT) CAPS capsule, Take 50,000 Units by mouth once a week., Disp: , Rfl:    Physical Exam:    There were no vitals taken for this visit.  Salient findings:  None, given this was a telephone visit.  Seprately Identifiable Procedures:  Prior to initiating any procedures, risks/benefits/alternatives were explained to the patient and verbal consent obtained. Procedure Note (Prior, not today) Pre-procedure diagnosis: Neck mass, rule out primary lesion Post-procedure diagnosis: Same Procedure: Transnasal Fiberoptic Laryngoscopy, CPT 31575 - Mod 25 Indication: see above Complications: None apparent EBL: 0 mL  The procedure was undertaken to further evaluate the patient's complaint above, with mirror exam inadequate for appropriate examination due to gag reflex and poor patient tolerance  Procedure:  Patient was identified as correct patient. Verbal consent was obtained. The nose was sprayed with oxymetazoline and 4% lidocaine . The The flexible laryngoscope was passed through the nose to view the nasal cavity, pharynx (oropharynx, hypopharynx) and larynx.  The larynx was examined at rest and during multiple phonatory tasks. Documentation was obtained and reviewed with patient. The scope was removed. The patient tolerated the procedure well.  Findings: The nasal cavity and nasopharynx did not reveal any masses  or lesions, mucosa appeared to be without obvious lesions. The tongue base, pharyngeal walls, piriform sinuses, vallecula, epiglottis and postcricoid region are normal in appearance - slightly prominent lingual tonsil on right. The visualized portion of the subglottis and proximal trachea is widely patent. The vocal folds are mobile bilaterally. There are no lesions on the free edge of the vocal folds nor elsewhere in the larynx worrisome for malignancy.      Electronically signed by: Eldora KATHEE Blanch, MD 04/02/2024 8:26 PM   Impression & Plans:  Tykeria Wawrzyniak is a 73 y.o. female with:  1. 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 V/s GT sulcus  We had a long discussion again re: options - surgical v/s RT. We discussed R/B/A and need for search of primary --- would recommend at least DL/Bx with ipsilateral tonsillectomy, possible bilateral and directed biopsies with frozen section  She agrees with this. If primary found, can consider TORS v/s RT  Will proceed with DL/direct biopsies first   See below regarding exact medications prescribed this encounter including dosages and route: No orders of the defined types were placed in this encounter.     Thank you for allowing me the opportunity to care for your patient. Please do not hesitate to contact me should you have any other questions.  Sincerely, Eldora Blanch, MD Otolaryngologist (ENT), [facility] Phone: [phone] Fax: [phone]  04/02/2024, 8:26 PM   I have personally spent 20 minutes involved in face-to-face and non-face-to-face activities for this patient on the day of the visit.  Professional time spent excludes any procedures performed but includes the following activities, in addition to those noted in the documentation: preparing to see the patient (review of outside documentation and results), performing a medically appropriate examination, discussion with other healthcare providers (radiation oncology)

## 2024-04-04 ENCOUNTER — Encounter (INDEPENDENT_AMBULATORY_CARE_PROVIDER_SITE_OTHER): Payer: Self-pay | Admitting: Otolaryngology

## 2024-04-04 ENCOUNTER — Ambulatory Visit (INDEPENDENT_AMBULATORY_CARE_PROVIDER_SITE_OTHER): Payer: PRIVATE HEALTH INSURANCE | Admitting: Otolaryngology

## 2024-04-04 VITALS — BP 151/84 | HR 103 | Ht 63.0 in | Wt 171.0 lb

## 2024-04-04 DIAGNOSIS — C109 Malignant neoplasm of oropharynx, unspecified: Secondary | ICD-10-CM

## 2024-04-04 NOTE — Progress Notes (Signed)
 Dear Dr. Debrah, Here is my assessment for our mutual patient, Vonzella Althaus. Thank you for allowing me the opportunity to care for your patient. Please do not hesitate to contact me should you have any other questions. Sincerely, Dr. Eldora Blanch  Otolaryngology Clinic Note Referring provider: Dr. Debrah HPI:  Cyntia Staley is a 73 y.o. female kindly referred by Dr. Debrah for evaluation of right neck mass and swelling  Initial visit (01/2024): She reports that she underwent a dental cleaning and a few days later, she had some right cheek swelling (happened suddenly). During this, she did not have any pain or redness, just the swelling. Went to ED, noted parotitis and treated for his with antibiotics with resolution. In the process, she was noted to have a right Level 2 LN with suspicion for necrosis and it was persistent on repeat CT and sent here. No neck pain at the time. No skin cancers Patient currently reports no symptoms H&N wise as below. Swelling has completely resolved. No neck pain. No throat mass Patient otherwise denies: - dysphagia, odynophagia, unintentional weight loss - changes in voice, shortness of breath, hemoptysis tobacco or significant alcohol history - ear pain, neck masses  --------------------------------------------------------- 03/11/2024 Seen in f/u. We had a long discussion regarding options and her PET and biopsy. She continues to fairly well from symptom standpoint.  --------------------------------------------------------- 03/13/2024 The patient gave consent to have this visit done by telemedicine / virtual visit, two identifiers were used to identify patient. This is also consent for access the chart and treat the patient via this visit. The patient is located in [place] .  I, the provider, am at the office.  We spent 20 minutes together for the visit.  Joined by telephone  Discussed after tumor board, recommend at least will have some form of  radiation most likely. We discussed options and appears most reasonable to do DL/Bx first to find possible lesion/primary.  --------------------------------------------------------- 04/04/2024 Seen in follow up. Discussed pathology and discussed with Dr. Izell. Some soreness. After discussion, she opted for radiation.   ENT Surgery: TMJ surgery (40 years ago) Personal or FHx of bleeding dz or anesthesia difficulty: no  GLP-1: no AP/AC: ASA 81  Tobacco: no  PMHx: HTN, HLD  Independent Review of Additional Tests or Records:  Dr. 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; slight right parotid increased density but no significant surrounding cellulitis or stranding, stable in size, some asymmetry right GT sulcus; medialized carotid R>L; CT Neck 01/14/2024 - some right parotid/cheek stranding, stable neck mass size Path 02/21/2024: SCCa, p16+ PET CT independently interpreted (03/06/2024): right PET avid LN, with some PET avidity right GT sulcus and tonsil area (slightly more compared to left). No other noted areas in H&N of suspicious PET avidity. 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   Hyperlipidemia    Hypertension    Neck mass 12/2023   right side neck   Pneumonia    x 1   Vitamin D deficiency      Past Surgical History:  Procedure Laterality Date   BREAST EXCISIONAL BIOPSY Right 1978  COLONOSCOPY  10/2021   in [place]   DIRECT LARYNGOSCOPY Bilateral 03/27/2024   Procedure: LARYNGOSCOPY, DIRECT;  Surgeon: [name], MD;  Location: [facility] OR;  Service: ENT;   Laterality: Bilateral;   IR US  GUIDE BX ASP/DRAIN  03/21/2024   LAPAROSCOPY     x 2 for endometriosis   REVERSE SHOULDER ARTHROPLASTY Left 05/29/2019   Procedure: REVERSE SHOULDER ARTHROPLASTY;  Surgeon: Cristy Bonner DASEN, MD;  Location: [facility] ORS;  Service: Orthopedics;  Laterality: Left;   right shoulder arthroscopy Right    TMJ ARTHROSCOPY     TONSILLECTOMY Right 03/27/2024   Procedure: TONSILLECTOMY, PARTIAL;  Surgeon: [name], MD;  Location: [facility] OR;  Service: ENT;  Laterality: Right;    Family History  Problem Relation Age of Onset   Hypertension Mother    Heart attack Father 37   Fibromyalgia Sister      Social Connections: Unknown (05/27/2019)   Social Connection and Isolation Panel    Frequency of Communication with Friends and Family: Patient declined    Frequency of Social Gatherings with Friends and Family: Patient declined    Attends Religious Services: Patient declined    Database administrator or Organizations: Patient declined    Attends Banker Meetings: Patient declined    Marital Status: Patient declined      Current Outpatient Medications:    acetaminophen  (TYLENOL ) 500 MG tablet, Take 2 tablets (1,000 mg total) by mouth every 6 (six) hours., Disp: 100 tablet, Rfl: 2   aspirin EC 81 MG tablet, Take 1 tablet (81 mg total) by mouth every morning., Disp: , Rfl:    Calcium Carb-Cholecalciferol 600-5 MG-MCG TABS, Take 1 tablet by mouth daily., Disp: , Rfl:    ibuprofen (MOTRIN IB) 200 MG tablet, Take 2 tablets (400 mg total) by mouth every 6 (six) hours., Disp: 100 tablet, Rfl: 2   Multiple Vitamins-Minerals (ADULT ONE DAILY GUMMIES) CHEW, Chew 2 tablets by mouth every morning., Disp: , Rfl:    olmesartan  (BENICAR ) 20 MG tablet, Take 20 mg by mouth every morning. , Disp: , Rfl: 3   simvastatin (ZOCOR) 40 MG tablet, Take 40 mg by mouth at bedtime. , Disp: , Rfl: 3   Vitamin D, Ergocalciferol, (DRISDOL) 1.25 MG (50000 UNIT) CAPS capsule, Take 50,000 Units  by mouth once a week., Disp: , Rfl:    Physical Exam:   BP (!) 151/84 (BP Location: Left Arm, Patient Position: Sitting, Cuff Size: Large)   Pulse (!) 103   Ht 5' 3 (1.6 m)   Wt 171 lb (77.6 kg)   SpO2 93%   BMI 30.29 kg/m   Salient findings:  CN II-XII intact Bilateral EAC clear and TM intact with well pneumatized middle ear spaces Anterior rhinoscopy: Septum intact; bilateral inferior turbinates without significant hypertrophy No lesions of oral cavity/oropharynx; no palpable GT sulcus lesion or tonsil lesion; strong gag No obviously palpable neck masses/lymphadenopathy/thyromegaly except slightly palpable outline of right level 2 mass, not significantly firm No respiratory distress or stridor  Seprately Identifiable Procedures:  Prior to initiating any procedures, risks/benefits/alternatives were explained to the patient and verbal consent obtained. Procedure Note (Prior, not today) Pre-procedure diagnosis: Neck mass, rule out primary lesion Post-procedure diagnosis: Same Procedure: Transnasal Fiberoptic Laryngoscopy, CPT 31575 - Mod 25 Indication: see above Complications: None apparent EBL: 0 mL  The procedure was undertaken to further evaluate the patient's complaint above, with mirror exam inadequate for appropriate examination due to gag reflex and poor patient tolerance  Procedure:  Patient was identified as correct patient. Verbal consent was obtained. The nose was sprayed with oxymetazoline and 4% lidocaine . The The flexible laryngoscope was passed through the nose to view the nasal cavity, pharynx (oropharynx, hypopharynx) and larynx.  The larynx was examined at rest and during multiple phonatory tasks. Documentation was obtained and reviewed with patient. The scope was removed. The patient tolerated the procedure well.  Findings: The nasal cavity and nasopharynx did not reveal any masses or lesions, mucosa appeared to be without obvious lesions. The tongue base,  pharyngeal walls, piriform sinuses, vallecula, epiglottis and postcricoid region are normal in appearance - slightly prominent lingual tonsil on right. The visualized portion of the subglottis and proximal trachea is widely patent. The vocal folds are mobile bilaterally. There are no lesions on the free edge of the vocal folds nor elsewhere in the larynx worrisome for malignancy.      Electronically signed by: Eldora KATHEE Blanch, MD 04/04/2024 8:18 AM   Impression & Plans:  Colletta Spillers is a 73 y.o. female with:  1. 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 And R/B/A. At this point she opted for RT. Will turn her over to Dr. Charisse team and f/u 3 months post treatment   See below regarding exact medications prescribed this encounter including dosages and route: No orders of the defined types were placed in this encounter.     Thank you for allowing me the opportunity to care for your patient. Please do not hesitate to contact me should you have any other questions.  Sincerely, Eldora Blanch, MD Otolaryngologist (ENT), [facility] Phone: [phone] Fax: [phone]  04/04/2024, 8:18 AM   I have personally spent 41 minutes involved in face-to-face and non-face-to-face activities for this patient on the day of the visit.  Professional time spent excludes any procedures performed but includes the following activities, in addition to those noted in the documentation: preparing to see the patient (review of outside documentation and results), performing a medically appropriate examination, extensive counseling

## 2024-04-05 ENCOUNTER — Telehealth (INDEPENDENT_AMBULATORY_CARE_PROVIDER_SITE_OTHER): Payer: Self-pay

## 2024-04-05 NOTE — Telephone Encounter (Signed)
 LVM informing patient that form she dropped off to be completed by provider for insurance was complete and ready for pick up.

## 2024-04-08 NOTE — Anesthesia Postprocedure Evaluation (Signed)
 Anesthesia Post Note  Patient: Amonie Wisser  Procedure(s) Performed: LARYNGOSCOPY, DIRECT (Bilateral: Throat) TONSILLECTOMY, PARTIAL (Right: Throat)     Patient location during evaluation: PACU Anesthesia Type: General Level of consciousness: awake and alert Pain management: pain level controlled Vital Signs Assessment: post-procedure vital signs reviewed and stable Respiratory status: spontaneous breathing, nonlabored ventilation, respiratory function stable and patient connected to nasal cannula oxygen Cardiovascular status: blood pressure returned to baseline and stable Postop Assessment: no apparent nausea or vomiting Anesthetic complications: no   No notable events documented.  Last Vitals:  Vitals:   03/27/24 1445 03/27/24 1450  BP: (!) 159/98 (!) 161/92  Pulse: 73 77  Resp: 13 20  Temp:  36.5 C  SpO2: 97% 94%    Last Pain:  Vitals:   03/27/24 1450  PainSc: 0-No pain                 Epifanio Lamar BRAVO

## 2024-04-10 NOTE — Progress Notes (Incomplete)
 Has armband been applied?  {yes no:314532}  Does patient have an allergy to IV contrast dye?: {yes no:314532}   Has patient ever received premedication for IV contrast dye?: {yes no:314532}   Date of lab work: {Time; dates multiple:15870} BUN: 9 CR: 0.68 eGFR: >60  Does patient take metformin?: {yes no:314532}  Is eGFR >60?: {yes no:314532} If no, when can patient resume? (Must be 48 hrs AFTER they receive IV contrast):  {Time; dates multiple:15870}  IV site: {iv locations:314275}  Has IV site been added to flowsheet?  {yes no:314532}  There were no vitals taken for this visit.

## 2024-04-10 NOTE — Progress Notes (Signed)
 Has armband been applied?  Yes.    Does patient have an allergy to IV contrast dye?: No.   Has patient ever received premedication for IV contrast dye?: No.   Date of lab work: March 27, 2024 BUN: 9 CR: 0.68 eGFR: >60  Does patient take metformin?: No.  Is eGFR >60?: Yes.    IV site: antecubital right, condition patent and no redness  Has IV site been added to flowsheet?  Yes.    BP (!) 161/90 (BP Location: Right Arm, Patient Position: Sitting, Cuff Size: Large)   Pulse 96   Temp 97.8 F (36.6 C)   Resp 18   Ht 5' 3 (1.6 m)   Wt 171 lb 3.2 oz (77.7 kg)   SpO2 98%   BMI 30.33 kg/m

## 2024-04-15 ENCOUNTER — Ambulatory Visit
Admission: RE | Admit: 2024-04-15 | Discharge: 2024-04-15 | Disposition: A | Discharge status: Home / Self Care | Payer: PRIVATE HEALTH INSURANCE | Source: Ambulatory Visit | Attending: Radiation Oncology | Admitting: Radiation Oncology

## 2024-04-15 VITALS — BP 161/90 | HR 96 | Temp 97.8°F | Resp 18 | Ht 63.0 in | Wt 171.2 lb

## 2024-04-15 DIAGNOSIS — C770 Secondary and unspecified malignant neoplasm of lymph nodes of head, face and neck: Secondary | ICD-10-CM | POA: Diagnosis present

## 2024-04-15 DIAGNOSIS — Z510 Encounter for antineoplastic radiation therapy: Secondary | ICD-10-CM | POA: Diagnosis present

## 2024-04-15 DIAGNOSIS — C760 Malignant neoplasm of head, face and neck: Secondary | ICD-10-CM | POA: Diagnosis present

## 2024-04-15 DIAGNOSIS — C7989 Secondary malignant neoplasm of other specified sites: Secondary | ICD-10-CM

## 2024-04-15 NOTE — Progress Notes (Signed)
 Oncology Nurse Navigator Documentation   To provide support, encouragement and care continuity, met with Ms. Derksen before her CT SIM.  She tolerated procedure without difficulty, denied questions/concerns.    I encouraged him to call me prior to her 10/28 [employer].   Delon Jefferson RN, BSN, OCN Head & Neck Oncology Nurse Navigator [facility] Phone # [phone]  Fax # [phone]

## 2024-04-19 DIAGNOSIS — Z510 Encounter for antineoplastic radiation therapy: Secondary | ICD-10-CM | POA: Diagnosis not present

## 2024-04-22 ENCOUNTER — Telehealth: Payer: Self-pay

## 2024-04-22 NOTE — Telephone Encounter (Signed)
 Nutrition  Received message from patient regarding needing to change nutrition appointment scheduled for Friday, 10/31.    Called patient back but unable to reach.  Left message.  Ranjit Ashurst B. Dasie SOLON, CSO, LDN Registered Dietitian [phone]

## 2024-04-22 NOTE — Progress Notes (Signed)
 Nutrition  Spoke with patient and unable to have phone call at scheduled time on Friday, 10/31.  RDs do not have any other open spots on 10/31.  Appointment cancelled for 10/31.  Patient will keep nutrition appointment on 11/4.  Reviewed that this is an in person appointment following radiation.    Susan Snow B. Dasie SOLON, CSO, LDN Registered Dietitian [phone]

## 2024-04-23 ENCOUNTER — Other Ambulatory Visit: Payer: Self-pay

## 2024-04-23 ENCOUNTER — Ambulatory Visit
Admission: RE | Admit: 2024-04-23 | Discharge: 2024-04-23 | Disposition: A | Discharge status: Home / Self Care | Payer: PRIVATE HEALTH INSURANCE | Source: Ambulatory Visit | Attending: Radiation Oncology | Admitting: Radiation Oncology

## 2024-04-23 DIAGNOSIS — Z510 Encounter for antineoplastic radiation therapy: Secondary | ICD-10-CM | POA: Diagnosis not present

## 2024-04-23 DIAGNOSIS — C770 Secondary and unspecified malignant neoplasm of lymph nodes of head, face and neck: Secondary | ICD-10-CM

## 2024-04-23 DIAGNOSIS — C4492 Squamous cell carcinoma of skin, unspecified: Secondary | ICD-10-CM

## 2024-04-23 LAB — RAD ONC ARIA SESSION SUMMARY
Course Elapsed Days: 0
Plan Fractions Treated to Date: 1
Plan Prescribed Dose Per Fraction: 2 Gy
Plan Total Fractions Prescribed: 35
Plan Total Prescribed Dose: 70 Gy
Reference Point Dosage Given to Date: 2 Gy
Reference Point Session Dosage Given: 2 Gy
Session Number: 1

## 2024-04-24 ENCOUNTER — Telehealth: Payer: Self-pay

## 2024-04-24 ENCOUNTER — Ambulatory Visit
Admission: RE | Admit: 2024-04-24 | Discharge: 2024-04-24 | Disposition: A | Discharge status: Home / Self Care | Payer: PRIVATE HEALTH INSURANCE | Source: Ambulatory Visit | Attending: Radiation Oncology | Admitting: Radiation Oncology

## 2024-04-24 ENCOUNTER — Other Ambulatory Visit: Payer: Self-pay

## 2024-04-24 DIAGNOSIS — Z510 Encounter for antineoplastic radiation therapy: Secondary | ICD-10-CM | POA: Diagnosis not present

## 2024-04-24 LAB — RAD ONC ARIA SESSION SUMMARY
Course Elapsed Days: 1
Plan Fractions Treated to Date: 2
Plan Prescribed Dose Per Fraction: 2 Gy
Plan Total Fractions Prescribed: 35
Plan Total Prescribed Dose: 70 Gy
Reference Point Dosage Given to Date: 4 Gy
Reference Point Session Dosage Given: 2 Gy
Session Number: 2

## 2024-04-24 NOTE — Telephone Encounter (Signed)
 [facility] Clinical Social Work  Clinical Social Work was referred by statistician for assessment of psychosocial needs.  Clinical Social Worker contacted patient by phone to offer support and assess for needs.        Follow Up Plan:  CSW will follow-up with patient by phone  on 11/03    Lizbeth Sprague, LCSW  Clinical Social Worker [facility]

## 2024-04-25 ENCOUNTER — Other Ambulatory Visit: Payer: Self-pay

## 2024-04-25 ENCOUNTER — Telehealth: Payer: Self-pay | Admitting: *Deleted

## 2024-04-25 ENCOUNTER — Ambulatory Visit
Admission: RE | Admit: 2024-04-25 | Discharge: 2024-04-25 | Disposition: A | Discharge status: Home / Self Care | Payer: PRIVATE HEALTH INSURANCE | Source: Ambulatory Visit | Attending: Radiation Oncology | Admitting: Radiation Oncology

## 2024-04-25 DIAGNOSIS — Z510 Encounter for antineoplastic radiation therapy: Secondary | ICD-10-CM | POA: Diagnosis not present

## 2024-04-25 LAB — RAD ONC ARIA SESSION SUMMARY
Course Elapsed Days: 2
Plan Fractions Treated to Date: 3
Plan Prescribed Dose Per Fraction: 2 Gy
Plan Total Fractions Prescribed: 35
Plan Total Prescribed Dose: 70 Gy
Reference Point Dosage Given to Date: 6 Gy
Reference Point Session Dosage Given: 2 Gy
Session Number: 3

## 2024-04-25 NOTE — Telephone Encounter (Signed)
 Completed [facility]-380-E and [employer] Disability paperwork   Sent to provider to review, amend, sign and return to this nurse to return to claims benefit manager.

## 2024-04-26 ENCOUNTER — Inpatient Hospital Stay: Payer: PRIVATE HEALTH INSURANCE | Admitting: Dietician

## 2024-04-26 ENCOUNTER — Other Ambulatory Visit: Payer: Self-pay

## 2024-04-26 ENCOUNTER — Ambulatory Visit
Admission: RE | Admit: 2024-04-26 | Discharge: 2024-04-26 | Disposition: A | Discharge status: Home / Self Care | Payer: PRIVATE HEALTH INSURANCE | Source: Ambulatory Visit | Attending: Radiation Oncology | Admitting: Radiation Oncology

## 2024-04-26 DIAGNOSIS — Z510 Encounter for antineoplastic radiation therapy: Secondary | ICD-10-CM | POA: Diagnosis not present

## 2024-04-26 LAB — RAD ONC ARIA SESSION SUMMARY
Course Elapsed Days: 3
Plan Fractions Treated to Date: 4
Plan Prescribed Dose Per Fraction: 2 Gy
Plan Total Fractions Prescribed: 35
Plan Total Prescribed Dose: 70 Gy
Reference Point Dosage Given to Date: 8 Gy
Reference Point Session Dosage Given: 2 Gy
Session Number: 4

## 2024-04-29 ENCOUNTER — Other Ambulatory Visit: Payer: Self-pay | Admitting: Radiation Oncology

## 2024-04-29 ENCOUNTER — Other Ambulatory Visit: Payer: Self-pay

## 2024-04-29 ENCOUNTER — Ambulatory Visit
Admission: RE | Admit: 2024-04-29 | Discharge: 2024-04-29 | Disposition: A | Discharge status: Home / Self Care | Payer: PRIVATE HEALTH INSURANCE | Source: Ambulatory Visit | Attending: Radiation Oncology | Admitting: Radiation Oncology

## 2024-04-29 ENCOUNTER — Inpatient Hospital Stay: Payer: PRIVATE HEALTH INSURANCE | Attending: Radiation Oncology

## 2024-04-29 DIAGNOSIS — C7989 Secondary malignant neoplasm of other specified sites: Secondary | ICD-10-CM | POA: Insufficient documentation

## 2024-04-29 DIAGNOSIS — C760 Malignant neoplasm of head, face and neck: Secondary | ICD-10-CM | POA: Diagnosis present

## 2024-04-29 DIAGNOSIS — C4492 Squamous cell carcinoma of skin, unspecified: Secondary | ICD-10-CM | POA: Diagnosis present

## 2024-04-29 LAB — RAD ONC ARIA SESSION SUMMARY
Course Elapsed Days: 6
Plan Fractions Treated to Date: 5
Plan Prescribed Dose Per Fraction: 2 Gy
Plan Total Fractions Prescribed: 35
Plan Total Prescribed Dose: 70 Gy
Reference Point Dosage Given to Date: 10 Gy
Reference Point Session Dosage Given: 2 Gy
Session Number: 5

## 2024-04-29 MED ORDER — SONAFINE EX EMUL: 1.0000 | Freq: Once | CUTANEOUS | Status: DC

## 2024-04-29 MED ORDER — LIDOCAINE VISCOUS HCL 2 % MT SOLN: OROMUCOSAL | 3 refills | Status: AC

## 2024-04-29 NOTE — Progress Notes (Signed)
 [facility] Clinical Social Work  Initial Assessment   Susan Snow is a 73 y.o. year old female contacted by phone. Clinical Social Work was referred by nurse navigator for assessment of psychosocial needs.   SDOH (Social Determinants of Health) assessments performed: Yes   SDOH Screenings   Food Insecurity: No Food Insecurity (04/29/2024)  Housing: Low Risk  (04/29/2024)  Transportation Needs: No Transportation Needs (04/29/2024)  Utilities: Not At Risk (04/29/2024)  Depression (PHQ2-9): Low Risk  (03/15/2024)  Physical Activity: Unknown (05/27/2019)  Social Connections: Unknown (05/27/2019)  Tobacco Use: Low Risk  (04/04/2024)   Received from [facility]    PHQ 2/9:    03/15/2024    9:29 AM  Depression screen PHQ 2/9  Decreased Interest 0  Down, Depressed, Hopeless 0  PHQ - 2 Score 0     Distress Screen completed: No     No data to display            Family/Social Information:  Housing Arrangement: patient lives with her spouse. Family members/support persons in your life? Susan Snow named her spouse, her two daughters (and spouses), and friends as her support system. Transportation concerns: Susan Snow will be transporting herself to appointments as she cans. If unable to, patient spouse or friends will assist.  Employment: Working full time - out on leave until January due to treatment.  Income source: Employment Financial concerns: No Type of concern: None Food access concerns: no Religious or spiritual practice: Yes-Christian. No Spirtual Distress noted.  Advanced directives: No  Coping/ Adjustment to diagnosis: Patient understands treatment plan and what happens next? yes, patient understands treatment plan.  Concerns about diagnosis and/or treatment: No reported concerns or changes in mood. Patient reported stressors: None  Current coping skills/ strengths: Ability for insight , Active sense of humor , Average or above average intelligence , Capable of independent  living , Communication skills , Financial means , General fund of knowledge , Motivation for treatment/growth , Physical Health , Religious Affiliation , and Supportive family/friends     SUMMARY: Current SDOH Barriers:  No SDOH barriers noted.   Clinical Social Work Clinical Goal(s):  No Clinical Social Work Goals Noted.  Interventions: Discussed common feeling and emotions when being diagnosed with cancer, and the importance of support during treatment Informed patient of the support team roles and support services at [facility] Provided CSW contact information and encouraged patient to call with any questions or concerns   Follow Up Plan: CSW will follow-up with patient by phone  Patient verbalizes understanding of plan: Yes   Lizbeth Sprague, LCSW Clinical Social Worker [facility]

## 2024-04-30 ENCOUNTER — Ambulatory Visit
Admission: RE | Admit: 2024-04-30 | Discharge: 2024-04-30 | Disposition: A | Discharge status: Home / Self Care | Payer: PRIVATE HEALTH INSURANCE | Source: Ambulatory Visit | Attending: Radiation Oncology | Admitting: Radiation Oncology

## 2024-04-30 ENCOUNTER — Inpatient Hospital Stay: Payer: PRIVATE HEALTH INSURANCE | Admitting: Nutrition

## 2024-04-30 ENCOUNTER — Other Ambulatory Visit: Payer: Self-pay

## 2024-04-30 DIAGNOSIS — C4492 Squamous cell carcinoma of skin, unspecified: Secondary | ICD-10-CM | POA: Diagnosis not present

## 2024-04-30 LAB — RAD ONC ARIA SESSION SUMMARY
Course Elapsed Days: 7
Plan Fractions Treated to Date: 6
Plan Prescribed Dose Per Fraction: 2 Gy
Plan Total Fractions Prescribed: 35
Plan Total Prescribed Dose: 70 Gy
Reference Point Dosage Given to Date: 12 Gy
Reference Point Session Dosage Given: 2 Gy
Session Number: 6

## 2024-04-30 NOTE — Therapy (Signed)
 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   Hyperlipidemia    Hypertension    Neck mass 12/2023   right side neck   Pneumonia    x 1   Vitamin D deficiency    Past Surgical History:  Procedure Laterality Date   BREAST EXCISIONAL BIOPSY Right 1978   COLONOSCOPY  10/2021   in [place]   DIRECT LARYNGOSCOPY Bilateral 03/27/2024   Procedure: LARYNGOSCOPY, DIRECT;  Surgeon: [name], MD;  Location: [facility] OR;  Service: ENT;  Laterality: Bilateral;   IR US  GUIDE BX ASP/DRAIN  03/21/2024   LAPAROSCOPY     x 2 for endometriosis   REVERSE SHOULDER ARTHROPLASTY Left 05/29/2019   Procedure: REVERSE SHOULDER ARTHROPLASTY;  Surgeon: Cristy Bonner DASEN, MD;  Location: [facility] ORS;  Service: Orthopedics;  Laterality: Left;   right shoulder arthroscopy Right    TMJ ARTHROSCOPY     TONSILLECTOMY Right 03/27/2024   Procedure: TONSILLECTOMY, PARTIAL;  Surgeon: [name], MD;  Location: [facility] OR;  Service: ENT;  Laterality: Right;   Patient Active Problem List   Diagnosis Date Noted   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 [facility]) 03/27/2024   Secondary malignant neoplasm of cervical lymph node (HCC) 03/15/2024   Head and neck cancer (HCC) 03/15/2024   Shoulder dislocation 05/27/2019   Hyperlipidemia 06/14/2016   Hypertension 06/14/2016    PCP: Josette Aho,  PA-C  REFERRING PROVIDER: Izell Domino, MD  REFERRING DIAG: C77.0 (ICD-10-CM) - Secondary malignant neoplasm of cervical lymph node (HCC) C44.92,C79.89 (ICD-10-CM) - 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 A CT neck was performed which showed swelling and heterogeneous enhancement of the right parotid gland suggestive of parotitis along with adjacent edema within the upper neck and tracking along the mylohyoid muscle on the right, and a mildly enlarged right level II cystic/necrotic lymph node. Other findings of potential clinical significance included bilateral maxillary sinus mucosal thickening (mild on the right and moderate on the left),  and cervical and thoracic spondylosis. Her imaging findings were favored to be infectious in etiology and she was prescribed a course of abx and anti-inflammatories at discharge. She relayed her ED visit to her PCP recommended an interval repeat imaging of the neck. 02/05/24 CT neck demonstrated: the enlarged right level 2A cervical node measuring 1.4 x 1.3 x 1.6 cm with central hypoattenuation, concerning for central necrosis, and subtle increased density of the right parotid gland without associated inflammatory  stranding or mass present. Imaging otherwise showed no masses or abnormal enhancements along the aerodigestive structures. She was referred to ENT. 02/19/24 She saw Dr. Tobie. Her right sided facial swelling had resolved by the time of this visit and she denied any head or neck symptoms overall. Physical examination performed during this visit however confirmed a slightly palpable right level 2 neck mass. No other abnormalities were appreciated on examination. A laryngoscopy was also performed at that time which was unremarkable other than slight prominence of the right lingual tonsil. 02/21/24 US  guided biopsy of right neck. Pathology showed findings consistent with SCC, p 16 +. 03/06/24 PET demonstrated: the right level IIA lymph node measuring 1.5 cm compatible with malignancy, and mildly asymmetric activity along the right palatoglossal arch/anterior pillar of fauces. No other asymmetric findings or significant activity was demonstrated elsewhere in the body to suggest a primary site. 03/27/24 Direct Laryngoscopy biopsy with Dr. Tobie which showed SCC to her right glossotonsillar sulcus. She will receive 35 fractions of radiation to her Oropharynx and bilateral neck. She started on 04/23/24 and will complete 06/11/24.  PATIENT GOALS:   to be educated about the signs and symptoms of lymphedema and learn post op HEP.   PAIN:  Are you having pain? No  PRECAUTIONS: Active CA and Comment (L reverse shoulder, R rotator cuff repair)  RED FLAGS: None   WEIGHT BEARING RESTRICTIONS: No  FALLS:  Has patient fallen in last 6 months? No Does the patient have a fear of falling that limits activity? No Is the patient reluctant to leave the house due to a fear of falling?No  LIVING ENVIRONMENT: Patient lives with: husband Garrel Lives in: House/apartment Has following equipment at home: None  OCCUPATION: full time job doing [employer] - activities  LEISURE: does not exercise but does a stretching program  at work and walks a lot at work  PRIOR LEVEL OF FUNCTION: Independent   OBJECTIVE: Note: Objective measures were completed at Evaluation unless otherwise noted.  COGNITION: Overall cognitive status: Within functional limits for tasks assessed                  POSTURE:  Forward head and rounded shoulders posture  30 SEC SIT TO STAND: 13 reps in 30 sec without use of UEs which is  Good for patient's age  SHOULDER AROM:   WFL   CERVICAL AROM:   Percent limited  Flexion 25% limited  Extension WFL  Right lateral flexion 50% limited  Left lateral flexion 25% limited  Right rotation 25% limited  Left rotation 25% limited    (Blank rows=not tested)   PATIENT EDUCATION:  Education details: Neck ROM, importance of posture when sitting, standing and lying down, deep breathing, walking program and importance of staying active throughout treatment, CURE article on staying active, Why exercise? flyer, lymphedema and PT info Person educated: Patient Education method: Explanation, Demonstration, Handout Education comprehension: Patient verbalized understanding and returned demonstration  HOME EXERCISE PROGRAM: Patient was instructed today in a home exercise program  today for head and neck range of motion exercises. These included active cervical flexion, active cervical extension, active cervical rotation to each direction, upper trap stretch, and shoulder retraction. Patient was encouraged to do these 2-3 times a day, holding for 5 sec each and completing for 5 reps. Pt was educated that once this becomes easier then hold the stretches for 30-60 seconds.    ASSESSMENT:  CLINICAL IMPRESSION: Pt arrives to PT with recently diagnosed  metastatic squamous cell carcinoma to R neck with primary R glossotonsillar sulcus cancer.  She will receive 35 fractions of radiation to her Oropharynx and bilateral neck. She started on 04/23/24 and will complete 06/11/24. Pt's cervical ROM was limited in  all directions except for extension. Educated pt about signs and symptoms of lymphedema as well as anatomy and physiology of lymphatic system. Educated pt in importance of staying as active as possible throughout treatment to decrease fatigue as well as head and neck ROM exercises to decrease loss of ROM. Will see pt after completion of radiation to reassess ROM and assess for lymphedema and to determine therapy needs at that time.  Pt will benefit from skilled therapeutic intervention to improve on the following deficits: Decreased knowledge of precautions and postural dysfunction. Other deficits: decreased ROM  PT treatment/interventions: ADL/self-care home management, pt/family education, therapeutic exercise. Other interventions [id number]- PT Re-evaluation, 97110-Therapeutic exercises, 97530- Therapeutic activity, [id number]- Neuromuscular re-education, 97535- Self Care, [id number]- Manual therapy, 97760- Orthotic Initial, [phone]- Orthotic/Prosthetic subsequent, and Patient/Family education  REHAB POTENTIAL: Good  CLINICAL DECISION MAKING: Evolving/moderate complexity  EVALUATION COMPLEXITY: Moderate   GOALS: Goals reviewed with patient? YES  LONG TERM GOALS: (STG=LTG)   Name Target Date  Goal status  1 Patient will be able to verbalize understanding of a home exercise program for cervical range of motion, posture, and walking.   Baseline:  No knowledge 05/02/2024 Achieved at eval  2 Patient will be able to verbalize understanding of proper sitting and standing posture. Baseline:  No knowledge 05/02/2024 Achieved at eval  3 Patient will be able to verbalize understanding of lymphedema risk and availability of treatment for this condition Baseline:  No knowledge 05/02/2024 Achieved at eval  4 Pt will demonstrate a return to full cervical ROM and function post operatively compared to baselines and not demonstrate any signs or symptoms of lymphedema.  Baseline: See objective measurements taken today.  07/11/24 New    PLAN:  PT FREQUENCY/DURATION: EVAL and 1 follow up appointment.   PLAN FOR NEXT SESSION: will reassess 2 weeks after completion of radiation to determine needs.  Patient will follow up at outpatient cancer rehab 2 weeks after completion of radiation.  If the patient requires physical therapy at that time, a specific plan will be dictated and sent to the referring physician for approval. The patient was educated today on appropriate basic range of motion exercises to begin now and continue throughout radiation and educated on the signs and symptoms of lymphedema. Patient verbalized good understanding.     Physical Therapy Information for During and After Head/Neck Cancer Treatment: Lymphedema is a swelling condition that you may be at risk for in your neck and/or face if you have radiation treatment to the area and/or if you have surgery that includes removing lymph nodes.  There is treatment available for this condition and it is not life-threatening.  Contact your physician or physical therapist with concerns. An excellent resource for those seeking information on lymphedema is the National Lymphedema Network's website.  It can  be accessed at www.lymphnet.org If you notice swelling in your neck or face at any time following surgery (even if it is many years from now), please contact your doctor or physical therapist to discuss this.  Lymphedema can be treated at any time but it is easier for you if it is treated early on. If you have had surgery to your neck, please check with your surgeon about how soon to start doing neck range of motion exercises.  If you are not having surgery, I encourage you to start doing neck range of motion exercises today and continue these while undergoing treatment, UNLESS you have irritation of your skin or soft tissue that is aggravated by doing them.  These exercises are intended to help you prevent loss of range of motion and/or to gain range of  motion in your neck (which can be limited by tightening effects of radiation), and NOT to aggravate these tissues if they develop sensitivities from treatment. Neck range of motion exercises should be done to the point of feeling a GENTLE, TOLERABLE stretch only.  You are encouraged to start a walking or other exercise program tomorrow and continue this as much as you are able through and after treatment.  Please feel free to call me with any questions. Florina Lanis Carbon, PT, CLT Physical Therapist and Certified Lymphedema Therapist [facility] [address], [place], [place] [address] [phone] [email or web address]  WALKING  Walking is a great form of exercise to increase your strength, endurance and overall fitness.  A walking program can help you start slowly and gradually build endurance as you go.  Everyone's ability is different, so each person's starting point will be different.  You do not have to follow them exactly.  The are just samples. You should simply find out what's right for you and stick to that program.   In the beginning, you'll start off walking 2-3 times a day for short distances.  As you get stronger, you'll be walking further at just 1-2 times per day.  A. You Can Walk For A Certain Length Of Time Each Day    Walk 5 minutes 3 times per day.  Increase 2 minutes every 2 days (3 times per day).  Work up to 25-30 minutes (1-2 times per day).   Example:   Day 1-2 5 minutes 3 times per day   Day 7-8 12 minutes 2-3 times per day   Day 13-14 25 minutes 1-2 times per day  B. You Can Walk For a Certain Distance Each Day     Distance can be substituted for time.    Example:   3 trips to mailbox (at road)   3 trips to corner of block   3 trips around the block  C. Go to local high school and use the track.    Walk for distance ____ around track  Or time ____ minutes  D. Walk ____ Jog ____ Run ___   Why  exercise?  So many benefits! Here are SOME of them: Heart health, including raising your good cholesterol level and reducing heart rate and blood pressure Lung health, including improved lung capacity It burns fats, and most of us  can stand to be leaner, whether or not we are overweight. It increases the body's natural painkillers and mood elevators, so makes you feel better. Not only makes you feel better, but look better too Improves sleep Takes a bite out of stress May decrease your risk of many  types of cancer If you are currently undergoing cancer treatment, exercise may improve your ability to tolerate treatments including chemotherapy. For everybody, it can improve your energy level. Those with cancer-related fatigue report a 40-50% reduction in this symptom when exercising regularly. If you are a survivor of breast, colon, or prostate cancer, it may decrease your risk of a recurrence. (This may hold for other cancers too, but so far we have data just for these three types.)  How to exercise: Get your doctor's okay. Pick something you enjoy doing, like walking, Zumba, biking, swimming, or whatever. Start at low intensity and time, then gradually increase.  (See walking program handout.) Set a goal to achieve over time.  The American Cancer Society, American Heart Association, and U.S. Dept. of Health and Human Services recommend 150 minutes of moderate exercise, 75 minutes of vigorous exercise, or a combination of both per week. This should be done in episodes at least 10 minutes long, spread throughout the week.  Need help being motivated? Pick something you enjoy doing, because you'll be more inclined to stick with that activity than something that feels like a chore. Do it with a friend so that you are accountable to each other. Schedule it into your day. Place it on your calendar and keep that appointment just like you do any appointment that you make. Join an exercise group that  meets at a specific time.  That way, you have to show up on time, and that makes it harder to procrastinate about doing your workout.  It also keeps you accountable--people begin to expect you to be there. Join a gym where you feel comfortable and not intimidated, at the right cost. Sign up for something that you'll need to be in shape for on a specific date, like a 1K or a 5K to walk or run, a 20 or 30 mile bike ride, a mud run or something like that. If the date is looming, you know you'll need to train to be ready for it.  An added benefit is that many of these are fundraisers for good causes. If you've already paid for a gym membership, group exercise class or event, you might as well work out, so you haven't wasted your money!    [facility], PT 05/02/2024, 8:47 AM

## 2024-04-30 NOTE — Progress Notes (Signed)
 73 year old female diagnosed with squamous cell cancer of the right neck from an unknown primary.  She is followed by Dr. Izell.  She will receive radiation therapy with her final treatment scheduled for December 16.  Past medical history includes hyperlipidemia, hypertension, vitamin D deficiency.  Medications include calcium, multivitamin, and vitamin D.  Labs include glucose 112 on October 1.  Height: 5 feet 3 inches. Weight: 171.6 pounds November 3. Patient weighed 178 pounds August 2025. BMI: 30.3  Reports recent weight loss of 6.4 pounds was due to stress of diagnosis and work up for treatment. She is not having any nutrition impact symptoms at this time. She is using baking soda and salt water  gargles. Has protein powder/drinks to try.  Nutrition diagnosis: Food and nutrition related knowledge deficit related to cancer and associated treatment as evidenced by no prior need for nutrition related information.  Intervention: Educated to consume small frequent meals and snacks with higher calorie, higher protein foods and strive for weight maintenance. Reviewed importance of baking soda and salt water  gargles.   Reviewed strategies for dry mouth. Provided nutrition facts sheets.   Contact information given.  Monitoring, evaluation, goals: Patient will tolerate adequate calories and protein to minimize weight loss throughout treatment.  Next visit: Tuesday, November 11 after radiation therapy with Elvie.  **Disclaimer: This note was dictated with voice recognition software. Similar sounding words can inadvertently be transcribed and this note may contain transcription errors which may not have been corrected upon publication of note.**

## 2024-04-30 NOTE — Telephone Encounter (Signed)
 Today received paperwork signed by provider.  Successfully faxed LOA request to number provided by patient for [facility] ()[phone]) and Disability form faxed to [employer] [phone]).  Copies to Radiation Oncology to be scanned.  Forms prepared to be mailed to address on file.   [address] [place] [address] No further instructions received or actions performed or required by this nurse.

## 2024-05-01 ENCOUNTER — Ambulatory Visit
Admission: RE | Admit: 2024-05-01 | Discharge: 2024-05-01 | Disposition: A | Discharge status: Home / Self Care | Payer: PRIVATE HEALTH INSURANCE | Source: Ambulatory Visit | Attending: Radiation Oncology | Admitting: Radiation Oncology

## 2024-05-01 ENCOUNTER — Other Ambulatory Visit: Payer: Self-pay

## 2024-05-01 DIAGNOSIS — C4492 Squamous cell carcinoma of skin, unspecified: Secondary | ICD-10-CM | POA: Diagnosis not present

## 2024-05-01 LAB — RAD ONC ARIA SESSION SUMMARY
Course Elapsed Days: 8
Plan Fractions Treated to Date: 7
Plan Prescribed Dose Per Fraction: 2 Gy
Plan Total Fractions Prescribed: 35
Plan Total Prescribed Dose: 70 Gy
Reference Point Dosage Given to Date: 14 Gy
Reference Point Session Dosage Given: 2 Gy
Session Number: 7

## 2024-05-01 NOTE — Therapy (Signed)
 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   Hyperlipidemia    Hypertension    Neck mass 12/2023   right side neck   Pneumonia    x 1   Vitamin D deficiency    Past Surgical History:  Procedure Laterality Date   BREAST EXCISIONAL BIOPSY Right 1978   COLONOSCOPY  10/2021   in [place]   DIRECT LARYNGOSCOPY Bilateral 03/27/2024   Procedure: LARYNGOSCOPY, DIRECT;  Surgeon: [name], MD;  Location: [facility] OR;  Service: ENT;  Laterality: Bilateral;   IR US  GUIDE BX ASP/DRAIN  03/21/2024   LAPAROSCOPY     x 2 for endometriosis   REVERSE SHOULDER ARTHROPLASTY Left 05/29/2019   Procedure: REVERSE SHOULDER ARTHROPLASTY;  Surgeon: Cristy Bonner DASEN, MD;  Location: [facility] ORS;  Service: Orthopedics;  Laterality: Left;   right shoulder arthroscopy Right    TMJ ARTHROSCOPY     TONSILLECTOMY Right 03/27/2024   Procedure: TONSILLECTOMY, PARTIAL;  Surgeon: [name], MD;  Location: [facility] OR;  Service: ENT;  Laterality: Right;   Patient Active Problem List   Diagnosis Date Noted   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 [facility]) 03/27/2024   Secondary malignant neoplasm of cervical lymph node (HCC) 03/15/2024   Head and neck cancer (HCC) 03/15/2024   Shoulder dislocation 05/27/2019   Hyperlipidemia 06/14/2016   Hypertension 06/14/2016    ONSET DATE: See  pertinent information below   REFERRING DIAG:  C77.0 (ICD-10-CM) - Secondary malignant neoplasm of cervical lymph node (HCC)  C44.92,C79.89 (ICD-10-CM) - 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 01/14/24 She presented to the ED with right sided facial swelling. A CT neck was performed which showed swelling and heterogeneous enhancement of the right parotid gland suggestive of parotitis along with adjacent edema within the upper neck and tracking along the mylohyoid muscle on the right, and a mildly enlarged right level II cystic/necrotic lymph node. Other findings of potential clinical significance included bilateral maxillary sinus mucosal thickening (mild on the right and moderate on the left), and cervical and thoracic spondylosis. Her imaging findings were favored to be infectious in etiology and she was prescribed a course of abx and anti-inflammatories at discharge. She relayed her ED visit to her PCP recommended an interval repeat imaging of the neck. 02/05/24 CT neck demonstrated: the enlarged right level 2A cervical node measuring 1.4 x 1.3 x  1.6 cm with central hypoattenuation, concerning for central necrosis, and subtle increased density of the right parotid gland without associated inflammatory stranding or mass present. Imaging otherwise showed no masses or abnormal enhancements along the aerodigestive structures. She was referred to ENT. 02/19/24 She saw Dr. Tobie. Her right sided facial swelling had resolved by the time of this visit and she denied any head or neck symptoms overall. Physical examination performed during this  visit however confirmed a slightly palpable right level 2 neck mass. No other abnormalities were appreciated on examination. A laryngoscopy was also performed at that time which was unremarkable other than slight prominence of the right lingual tonsil. 02/21/24 US  guided biopsy of right neck. Pathology showed findings consistent with SCC, p 16 +.03/06/24 PET demonstrated: the right level IIA lymph node measuring 1.5 cm compatible with malignancy, and mildly asymmetric activity along the right palatoglossal arch/anterior pillar of fauces. No other asymmetric findings or significant activity was demonstrated elsewhere in the body to suggest a primary site. 03/15/24 Consult with Dr. Izell. She will receive radiation only. 03/27/24 Direct Laryngoscopy biopsy with Dr. Tobie which showed SCC to her right glossotonsillar sulcus. Treatment plan: She will receive 35 fractions of radiation to her Oropharynx and bilateral neck. She started on 04/23/24 and will complete 06/11/24.  PAIN:  Are you having pain? No  FALLS: Has patient fallen in last 6 months?  No  LIVING ENVIRONMENT: Lives with: lives with their spouse Lives in: House/apartment  PLOF:  Level of assistance: Independent with ADLs, Independent with IADLs Employment: Full-time employment  PATIENT GOALS: Maintain swallowing  OBJECTIVE:  Note: Objective measures were completed at Evaluation unless otherwise noted. DIAGNOSTIC FINDINGS: see pertinent info above  INSTRUMENTAL SWALLOW STUDY FINDINGS :none  COGNITION: Overall cognitive status: Within functional limits for tasks assessed  LANGUAGE: Receptive and Expressive language appeared WNL.  ORAL MOTOR EXAMINATION: Overall status: WFL  MOTOR SPEECH: Overall motor speech: Appears intact Respiration: thoracic breathing Phonation: normal and hoarse Resonance: WFL Articulation: Appears intact Intelligibility: Intelligible  SUBJECTIVE DYSPHAGIA REPORTS:  Date of onset: n/a Reported  symptoms: none reported  Current diet: regular and thin liquids  Co-morbid voice changes: Yes  FACTORS WHICH MAY INCREASE RISK OF ADVERSE EVENT IN PRESENCE OF ASPIRATION:  General health: well appearing  Risk factors: none evident   CLINICAL SWALLOW ASSESSMENT:   Dentition: adequate natural dentition Vocal quality at baseline: normal and hoarse Patient directly observed with POs: Yes: dysphagia 3 (soft) and thin liquids  Feeding: able to feed self Liquids provided by: cup Oral phase signs and symptoms: prolonged mastication Pharyngeal phase signs and symptoms: none noted today                                                                                                                             TREATMENT DATE:   05/02/24: Research states the risk for dysphagia increases due to radiation and/or chemotherapy treatment due to a variety of factors, so SLP educated  the pt about the possibility of reduced/limited ability for PO intake during rad tx. SLP also educated pt regarding possible changes to swallowing musculature after rad tx, and why adherence to dysphagia HEP provided today and PO consumption was necessary to inhibit muscle fibrosis following rad tx and to mitigate muscle disuse atrophy. SLP informed pt why this would be detrimental to their swallowing status and to their pulmonary health. Pt demonstrated understanding of these things to SLP. SLP encouraged pt to safely eat and drink as deep into their radiation/chemotherapy as possible to provide the best possible long-term swallowing outcome for pt.  SLP then developed an individualized HEP for pt involving oral and pharyngeal strengthening and ROM and pt was instructed how to perform these exercises, including SLP demonstration. After SLP demonstration, pt return demonstrated each exercise. SLP ensured pt performance was correct prior to educating pt on next exercise. Pt required usual mod cues faded to modified independent to  perform HEP. Pt was instructed to complete this program 5-7 days/week, at least 20 reps a day until 6 months after his or her last day of rad tx, and then x2 a week after that, indefinitely. Among other modifications for days when pt cannot functionally swallow, SLP also suggested pt to perform only non-swallowing tasks on the handout/HEP, and if necessary to cycle through the swallowing portion so the full program of exercises can be completed instead of fatiguing on one of the swallowing exercises and being unable to perform the other swallowing exercises. SLP instructed that swallowing exercises should then be added back into the regimen as pt is able to do so.   PATIENT EDUCATION: Education details: late effects head/neck radiation on swallow function, HEP procedure, and modification to HEP when difficulty experienced with swallowing during and after radiation course Person educated: Patient Education method: Explanation, Demonstration, Verbal cues, and Handouts Education comprehension: verbalized understanding, returned demonstration, verbal cues required, and needs further education   ASSESSMENT:  CLINICAL IMPRESSION: Patient is a 73 y.o. F who was seen today for assessment of swallowing as they undergo radiation/chemoradiation therapy. Today pt ate lunch meat and drank thin liquids without overt s/s oral or pharyngeal difficulty. At this time pt swallowing is deemed WNL/WFL with these POs, when utilizing smaller bite sizes as pt is currently. No oral or overt s/sx pharyngeal deficits, including aspiration were observed. There are no overt s/s aspiration PNA observed by SLP nor any reported by pt at this time. Data indicate that pt's swallow ability will likely decrease over the course of radiation/chemoradiation therapy and could very well decline over time following the conclusion of that therapy due to muscle disuse atrophy and/or muscle fibrosis. Pt will cont to need to be seen by SLP in order  to assess safety of PO intake, assess the need for recommending any objective swallow assessment, and ensuring pt is correctly completing the individualized HEP.  OBJECTIVE IMPAIRMENTS: include dysphagia. These impairments are limiting patient from safety when swallowing. Factors affecting potential to achieve goals and functional outcome are none noted today. Patient will benefit from skilled SLP services to address above impairments and improve overall function.   REHAB POTENTIAL: Good     GOALS: Goals reviewed with patient? No   SHORT TERM GOALS: Target: 3rd total session   1. Pt will complete HEP with modified independence in 2 sessions Baseline: Goal status: INITIAL   2.  pt will tell SLP why pt is completing HEP with modified independence Baseline:  Goal status: INITIAL  3.  pt will describe 3 overt s/s aspiration PNA with modified independence Baseline:  Goal status: INITIAL   4.  pt will tell SLP how a food journal could hasten return to a more normalized diet Baseline:  Goal status: INITIAL     LONG TERM GOALS: Target: 7th total session   1.  pt will complete HEP with independence over two visits Baseline:  Goal status: INITIAL   2.  pt will describe how to modify HEP over time, and the timeline associated with reduction in HEP frequency with modified independence over two sessions Baseline:  Goal status: INITIAL     PLAN:   SLP FREQUENCY:  once approx every 4 weeks   SLP DURATION:  7 sessions   PLANNED INTERVENTIONS: Aspiration precaution training, Pharyngeal strengthening exercises, Diet toleration management , Trials of upgraded texture/liquids, SLP instruction and feedback, Compensatory strategies, and Patient/family education, [phone] (treatment of swallowing dysfunction and/or oral function for feeding)   Truth Barot, CCC-SLP 05/02/2024, 10:42 PM

## 2024-05-02 ENCOUNTER — Other Ambulatory Visit: Payer: Self-pay

## 2024-05-02 ENCOUNTER — Ambulatory Visit: Payer: PRIVATE HEALTH INSURANCE | Attending: Radiation Oncology | Admitting: Physical Therapy

## 2024-05-02 ENCOUNTER — Encounter: Payer: Self-pay | Admitting: Physical Therapy

## 2024-05-02 ENCOUNTER — Ambulatory Visit
Admission: RE | Admit: 2024-05-02 | Discharge: 2024-05-02 | Disposition: A | Discharge status: Home / Self Care | Payer: PRIVATE HEALTH INSURANCE | Source: Ambulatory Visit | Attending: Radiation Oncology | Admitting: Radiation Oncology

## 2024-05-02 ENCOUNTER — Ambulatory Visit: Payer: PRIVATE HEALTH INSURANCE | Attending: Radiation Oncology

## 2024-05-02 DIAGNOSIS — R1310 Dysphagia, unspecified: Secondary | ICD-10-CM | POA: Diagnosis present

## 2024-05-02 DIAGNOSIS — C7989 Secondary malignant neoplasm of other specified sites: Secondary | ICD-10-CM | POA: Diagnosis present

## 2024-05-02 DIAGNOSIS — R293 Abnormal posture: Secondary | ICD-10-CM | POA: Insufficient documentation

## 2024-05-02 DIAGNOSIS — C770 Secondary and unspecified malignant neoplasm of lymph nodes of head, face and neck: Secondary | ICD-10-CM | POA: Insufficient documentation

## 2024-05-02 DIAGNOSIS — C4492 Squamous cell carcinoma of skin, unspecified: Secondary | ICD-10-CM | POA: Diagnosis present

## 2024-05-02 LAB — RAD ONC ARIA SESSION SUMMARY
Course Elapsed Days: 9
Plan Fractions Treated to Date: 8
Plan Prescribed Dose Per Fraction: 2 Gy
Plan Total Fractions Prescribed: 35
Plan Total Prescribed Dose: 70 Gy
Reference Point Dosage Given to Date: 16 Gy
Reference Point Session Dosage Given: 2 Gy
Session Number: 8

## 2024-05-02 NOTE — Patient Instructions (Signed)
 SWALLOWING EXERCISES Do these 5-6 days/week until 6 months after your last day of radiation, then 2 days per week afterwards You can use 1-2 drops of liquid to help you swallow, if your mouth gets dry  Effortful Swallows - Press your tongue against the roof of your mouth for 3 seconds, then swallow as hard as you can - Do at least 20 reps/day, in sets of 5-10  Masako Swallow - swallow with your tongue sticking out - Stick tongue out past your lips and gently bite tongue with your teeth - Swallow, while holding your tongue with your teeth - Do at least 20 reps/day, in sets of 5-10   Shaker Exercise - head lift - Lie flat on your back in your bed, the floor, or a couch  - Raise your head and look at your feet - KEEP YOUR SHOULDERS DOWN - HOLD FOR 45-60 SECONDS, then lower your head back down - Repeat 3 times, 2-3 times a day  [employer] - "squeeze swallow" exercise - Swallow, and squeeze tight to keep your Adam's Apple up - Hold the squeeze for 5-7 seconds - then relax - Do at least 20 reps/day, in sets of 5-10

## 2024-05-03 ENCOUNTER — Ambulatory Visit
Admission: RE | Admit: 2024-05-03 | Discharge: 2024-05-03 | Disposition: A | Discharge status: Home / Self Care | Payer: PRIVATE HEALTH INSURANCE | Source: Ambulatory Visit | Attending: Radiation Oncology | Admitting: Radiation Oncology

## 2024-05-03 ENCOUNTER — Other Ambulatory Visit: Payer: Self-pay

## 2024-05-03 DIAGNOSIS — C4492 Squamous cell carcinoma of skin, unspecified: Secondary | ICD-10-CM | POA: Diagnosis not present

## 2024-05-03 LAB — RAD ONC ARIA SESSION SUMMARY
Course Elapsed Days: 10
Plan Fractions Treated to Date: 9
Plan Prescribed Dose Per Fraction: 2 Gy
Plan Total Fractions Prescribed: 35
Plan Total Prescribed Dose: 70 Gy
Reference Point Dosage Given to Date: 18 Gy
Reference Point Session Dosage Given: 2 Gy
Session Number: 9

## 2024-05-06 ENCOUNTER — Ambulatory Visit
Admission: RE | Admit: 2024-05-06 | Discharge: 2024-05-06 | Disposition: A | Discharge status: Home / Self Care | Payer: PRIVATE HEALTH INSURANCE | Source: Ambulatory Visit | Attending: Radiation Oncology | Admitting: Radiation Oncology

## 2024-05-06 ENCOUNTER — Other Ambulatory Visit: Payer: Self-pay

## 2024-05-06 DIAGNOSIS — C4492 Squamous cell carcinoma of skin, unspecified: Secondary | ICD-10-CM | POA: Diagnosis not present

## 2024-05-06 DIAGNOSIS — C7989 Secondary malignant neoplasm of other specified sites: Secondary | ICD-10-CM

## 2024-05-06 LAB — RAD ONC ARIA SESSION SUMMARY
Course Elapsed Days: 13
Plan Fractions Treated to Date: 10
Plan Prescribed Dose Per Fraction: 2 Gy
Plan Total Fractions Prescribed: 35
Plan Total Prescribed Dose: 70 Gy
Reference Point Dosage Given to Date: 20 Gy
Reference Point Session Dosage Given: 2 Gy
Session Number: 10

## 2024-05-06 MED ORDER — RADIAPLEXRX EX GEL: Freq: Once | CUTANEOUS | Status: AC

## 2024-05-07 ENCOUNTER — Ambulatory Visit
Admission: RE | Admit: 2024-05-07 | Discharge: 2024-05-07 | Disposition: A | Discharge status: Home / Self Care | Payer: PRIVATE HEALTH INSURANCE | Source: Ambulatory Visit | Attending: Radiation Oncology | Admitting: Radiation Oncology

## 2024-05-07 ENCOUNTER — Telehealth: Payer: Self-pay | Admitting: Dietician

## 2024-05-07 ENCOUNTER — Inpatient Hospital Stay: Payer: PRIVATE HEALTH INSURANCE | Admitting: Dietician

## 2024-05-07 ENCOUNTER — Other Ambulatory Visit: Payer: Self-pay

## 2024-05-07 DIAGNOSIS — C4492 Squamous cell carcinoma of skin, unspecified: Secondary | ICD-10-CM | POA: Diagnosis not present

## 2024-05-07 LAB — RAD ONC ARIA SESSION SUMMARY
Course Elapsed Days: 14
Plan Fractions Treated to Date: 11
Plan Prescribed Dose Per Fraction: 2 Gy
Plan Total Fractions Prescribed: 35
Plan Total Prescribed Dose: 70 Gy
Reference Point Dosage Given to Date: 22 Gy
Reference Point Session Dosage Given: 2 Gy
Session Number: 11

## 2024-05-07 NOTE — Telephone Encounter (Addendum)
 Nutrition Follow-up:  Pt with squamous cell cancer of the right neck from an unknown primary. She is followed by Dr. Izell. She will receive radiation therapy with her final treatment scheduled for December 16.   Spoke with patient via telephone. She is tolerating treatment well so far. Has mild soreness to throat after completing swallowing exercises. Patient tolerating regular diet. Says she is Eating what I want. Some foods don't taste quite right. Appetite is pretty good. Sensitivity to smells at baseline, which has increased. Recalls unable to eat cinnamon toast yesterday morning due to strong smell. Had one day over the weekend where she felt slightly nauseous. This resolved w/o antiemetics. She is drinking Ensure HP as well as Protein 2.0 (15 g) 70 kcal (grape flavor). Patient doing baking soda salt water  gargles.   Medications: reviewed   Labs: no new meds  Anthropometrics: Wt 169 lb on 11/10 redia)  11/3 - 171.6 lb   NUTRITION DIAGNOSIS: Food and nutrition related knowledge deficit improving    INTERVENTION:  Encourage high calorie high protein foods  Continue drinking Ensure Plus 2/day in between meals  Continue baking soda salt water  gargles Educated on strategies for taste smell changes - will provide handout at f/u  Support and encouragement     MONITORING, EVALUATION, GOAL: wt trends, intake   NEXT VISIT: Tuesday November 18 after RT (pt aware)

## 2024-05-08 ENCOUNTER — Other Ambulatory Visit: Payer: Self-pay

## 2024-05-08 ENCOUNTER — Ambulatory Visit
Admission: RE | Admit: 2024-05-08 | Discharge: 2024-05-08 | Disposition: A | Discharge status: Home / Self Care | Payer: PRIVATE HEALTH INSURANCE | Source: Ambulatory Visit | Attending: Radiation Oncology | Admitting: Radiation Oncology

## 2024-05-08 DIAGNOSIS — C4492 Squamous cell carcinoma of skin, unspecified: Secondary | ICD-10-CM | POA: Diagnosis not present

## 2024-05-08 LAB — RAD ONC ARIA SESSION SUMMARY
Course Elapsed Days: 15
Plan Fractions Treated to Date: 12
Plan Prescribed Dose Per Fraction: 2 Gy
Plan Total Fractions Prescribed: 35
Plan Total Prescribed Dose: 70 Gy
Reference Point Dosage Given to Date: 24 Gy
Reference Point Session Dosage Given: 2 Gy
Session Number: 12

## 2024-05-09 ENCOUNTER — Inpatient Hospital Stay: Payer: PRIVATE HEALTH INSURANCE | Admitting: Nutrition

## 2024-05-09 ENCOUNTER — Other Ambulatory Visit: Payer: Self-pay

## 2024-05-09 ENCOUNTER — Ambulatory Visit
Admission: RE | Admit: 2024-05-09 | Discharge: 2024-05-09 | Disposition: A | Discharge status: Home / Self Care | Payer: PRIVATE HEALTH INSURANCE | Source: Ambulatory Visit | Attending: Radiation Oncology | Admitting: Radiation Oncology

## 2024-05-09 DIAGNOSIS — C4492 Squamous cell carcinoma of skin, unspecified: Secondary | ICD-10-CM | POA: Diagnosis not present

## 2024-05-09 LAB — RAD ONC ARIA SESSION SUMMARY
Course Elapsed Days: 16
Plan Fractions Treated to Date: 13
Plan Prescribed Dose Per Fraction: 2 Gy
Plan Total Fractions Prescribed: 35
Plan Total Prescribed Dose: 70 Gy
Reference Point Dosage Given to Date: 26 Gy
Reference Point Session Dosage Given: 2 Gy
Session Number: 13

## 2024-05-10 ENCOUNTER — Ambulatory Visit
Admission: RE | Admit: 2024-05-10 | Discharge: 2024-05-10 | Disposition: A | Discharge status: Home / Self Care | Payer: PRIVATE HEALTH INSURANCE | Source: Ambulatory Visit | Attending: Radiation Oncology | Admitting: Radiation Oncology

## 2024-05-10 ENCOUNTER — Other Ambulatory Visit: Payer: Self-pay

## 2024-05-10 DIAGNOSIS — C4492 Squamous cell carcinoma of skin, unspecified: Secondary | ICD-10-CM | POA: Diagnosis not present

## 2024-05-10 LAB — RAD ONC ARIA SESSION SUMMARY
Course Elapsed Days: 17
Plan Fractions Treated to Date: 14
Plan Prescribed Dose Per Fraction: 2 Gy
Plan Total Fractions Prescribed: 35
Plan Total Prescribed Dose: 70 Gy
Reference Point Dosage Given to Date: 28 Gy
Reference Point Session Dosage Given: 2 Gy
Session Number: 14

## 2024-05-13 ENCOUNTER — Ambulatory Visit
Admission: RE | Admit: 2024-05-13 | Discharge: 2024-05-13 | Disposition: A | Discharge status: Home / Self Care | Payer: PRIVATE HEALTH INSURANCE | Source: Ambulatory Visit | Attending: Radiation Oncology | Admitting: Radiation Oncology

## 2024-05-13 ENCOUNTER — Other Ambulatory Visit: Payer: Self-pay

## 2024-05-13 DIAGNOSIS — C4492 Squamous cell carcinoma of skin, unspecified: Secondary | ICD-10-CM | POA: Diagnosis not present

## 2024-05-13 LAB — RAD ONC ARIA SESSION SUMMARY
Course Elapsed Days: 20
Plan Fractions Treated to Date: 15
Plan Prescribed Dose Per Fraction: 2 Gy
Plan Total Fractions Prescribed: 35
Plan Total Prescribed Dose: 70 Gy
Reference Point Dosage Given to Date: 30 Gy
Reference Point Session Dosage Given: 2 Gy
Session Number: 15

## 2024-05-14 ENCOUNTER — Ambulatory Visit
Admission: RE | Admit: 2024-05-14 | Discharge: 2024-05-14 | Disposition: A | Discharge status: Home / Self Care | Payer: PRIVATE HEALTH INSURANCE | Source: Ambulatory Visit | Attending: Radiation Oncology | Admitting: Radiation Oncology

## 2024-05-14 ENCOUNTER — Other Ambulatory Visit: Payer: Self-pay

## 2024-05-14 ENCOUNTER — Telehealth (INDEPENDENT_AMBULATORY_CARE_PROVIDER_SITE_OTHER): Payer: Self-pay

## 2024-05-14 ENCOUNTER — Inpatient Hospital Stay: Payer: PRIVATE HEALTH INSURANCE | Admitting: Dietician

## 2024-05-14 DIAGNOSIS — C4492 Squamous cell carcinoma of skin, unspecified: Secondary | ICD-10-CM | POA: Diagnosis not present

## 2024-05-14 LAB — RAD ONC ARIA SESSION SUMMARY
Course Elapsed Days: 21
Plan Fractions Treated to Date: 16
Plan Prescribed Dose Per Fraction: 2 Gy
Plan Total Fractions Prescribed: 35
Plan Total Prescribed Dose: 70 Gy
Reference Point Dosage Given to Date: 32 Gy
Reference Point Session Dosage Given: 2 Gy
Session Number: 16

## 2024-05-14 NOTE — Telephone Encounter (Signed)
 Spoke to patient and informed her that she would need to complete a medical records request form. Patient verbalized understanding.

## 2024-05-14 NOTE — Telephone Encounter (Signed)
 Patient called and left voicemail wanting to get a copy of her pathology results with her diagnosis for insurance. Please advise.

## 2024-05-14 NOTE — Progress Notes (Signed)
 Nutrition Follow-up:  Pt with squamous cell cancer of the right neck from an unknown primary. She is followed by Dr. Izell. She will receive radiation therapy with her final treatment scheduled for December 16.   Met with patient in office after radiation. Patient reports tolerating treatment well overall. Denies sore throat. Taste of foods is off which is worsened by sensitivity to smells at times. Does well with life cereal softened with milk, bryers ice cream float with ginger ale, mac/cheese, buttered toast, chicken noodle soup. Reports bad day with thick saliva last Thursday causing nausea with episodes of vomiting. Reports diarrhea on Sunday which she feels is normal as every couple months she gets bouts of diarrhea. Patient is doing baking soda salt water  gargles 5-6 times/day. She is drinking lots of water . Patient reports decreased intake of Ensure and protein 2.0 supplements as these taste bad. Planning to try blending with ice cream.    Medications: reviewed   Labs: no new labs   Anthropometrics: Wt 166.6 lb on 11/17 (aria) decreased   11/10 - 169 lb 11/3 - 171.6 lb    NUTRITION DIAGNOSIS: Food and nutrition related knowledge deficit improved    INTERVENTION:  Educated on strategies for thick saliva - suggested ginger ale/sprite rinses, continue baking soda salt water  gargles Encourage high calorie high protein shakes - provided samples of vanilla CIB as she does not care for chocolate currently Suggested adding canned chicken or bone broth to soup for added protein     MONITORING, EVALUATION, GOAL: wt trends, intake    NEXT VISIT: Tuesday November 25 via telephone with Heron (pt aware)

## 2024-05-15 ENCOUNTER — Ambulatory Visit
Admission: RE | Admit: 2024-05-15 | Discharge: 2024-05-15 | Disposition: A | Discharge status: Home / Self Care | Payer: PRIVATE HEALTH INSURANCE | Source: Ambulatory Visit | Attending: Radiation Oncology | Admitting: Radiation Oncology

## 2024-05-15 ENCOUNTER — Other Ambulatory Visit: Payer: Self-pay

## 2024-05-15 DIAGNOSIS — C4492 Squamous cell carcinoma of skin, unspecified: Secondary | ICD-10-CM | POA: Diagnosis not present

## 2024-05-15 LAB — RAD ONC ARIA SESSION SUMMARY
Course Elapsed Days: 22
Plan Fractions Treated to Date: 17
Plan Prescribed Dose Per Fraction: 2 Gy
Plan Total Fractions Prescribed: 35
Plan Total Prescribed Dose: 70 Gy
Reference Point Dosage Given to Date: 34 Gy
Reference Point Session Dosage Given: 2 Gy
Session Number: 17

## 2024-05-16 ENCOUNTER — Ambulatory Visit
Admission: RE | Admit: 2024-05-16 | Discharge: 2024-05-16 | Disposition: A | Discharge status: Home / Self Care | Payer: PRIVATE HEALTH INSURANCE | Source: Ambulatory Visit | Attending: Radiation Oncology | Admitting: Radiation Oncology

## 2024-05-16 ENCOUNTER — Other Ambulatory Visit: Payer: Self-pay

## 2024-05-16 DIAGNOSIS — C4492 Squamous cell carcinoma of skin, unspecified: Secondary | ICD-10-CM | POA: Diagnosis not present

## 2024-05-16 LAB — RAD ONC ARIA SESSION SUMMARY
Course Elapsed Days: 23
Plan Fractions Treated to Date: 18
Plan Prescribed Dose Per Fraction: 2 Gy
Plan Total Fractions Prescribed: 35
Plan Total Prescribed Dose: 70 Gy
Reference Point Dosage Given to Date: 36 Gy
Reference Point Session Dosage Given: 2 Gy
Session Number: 18

## 2024-05-17 ENCOUNTER — Other Ambulatory Visit: Payer: Self-pay

## 2024-05-17 ENCOUNTER — Ambulatory Visit
Admission: RE | Admit: 2024-05-17 | Discharge: 2024-05-17 | Disposition: A | Discharge status: Home / Self Care | Payer: PRIVATE HEALTH INSURANCE | Source: Ambulatory Visit | Attending: Radiation Oncology | Admitting: Radiation Oncology

## 2024-05-17 DIAGNOSIS — C4492 Squamous cell carcinoma of skin, unspecified: Secondary | ICD-10-CM | POA: Diagnosis not present

## 2024-05-17 LAB — RAD ONC ARIA SESSION SUMMARY
Course Elapsed Days: 24
Plan Fractions Treated to Date: 19
Plan Prescribed Dose Per Fraction: 2 Gy
Plan Total Fractions Prescribed: 35
Plan Total Prescribed Dose: 70 Gy
Reference Point Dosage Given to Date: 38 Gy
Reference Point Session Dosage Given: 2 Gy
Session Number: 19

## 2024-05-19 ENCOUNTER — Ambulatory Visit: Payer: PRIVATE HEALTH INSURANCE

## 2024-05-19 ENCOUNTER — Other Ambulatory Visit: Payer: Self-pay

## 2024-05-19 ENCOUNTER — Ambulatory Visit
Admission: RE | Admit: 2024-05-19 | Discharge: 2024-05-19 | Disposition: A | Discharge status: Home / Self Care | Payer: PRIVATE HEALTH INSURANCE | Source: Ambulatory Visit | Attending: Radiation Oncology | Admitting: Radiation Oncology

## 2024-05-19 DIAGNOSIS — C4492 Squamous cell carcinoma of skin, unspecified: Secondary | ICD-10-CM | POA: Diagnosis not present

## 2024-05-19 LAB — RAD ONC ARIA SESSION SUMMARY
Course Elapsed Days: 26
Plan Fractions Treated to Date: 20
Plan Prescribed Dose Per Fraction: 2 Gy
Plan Total Fractions Prescribed: 35
Plan Total Prescribed Dose: 70 Gy
Reference Point Dosage Given to Date: 40 Gy
Reference Point Session Dosage Given: 2 Gy
Session Number: 20

## 2024-05-20 ENCOUNTER — Other Ambulatory Visit: Payer: Self-pay

## 2024-05-20 ENCOUNTER — Ambulatory Visit: Payer: PRIVATE HEALTH INSURANCE

## 2024-05-20 ENCOUNTER — Other Ambulatory Visit: Payer: Self-pay | Admitting: Radiation Oncology

## 2024-05-20 ENCOUNTER — Ambulatory Visit
Admission: RE | Admit: 2024-05-20 | Discharge: 2024-05-20 | Disposition: A | Discharge status: Home / Self Care | Payer: PRIVATE HEALTH INSURANCE | Source: Ambulatory Visit | Attending: Radiation Oncology | Admitting: Radiation Oncology

## 2024-05-20 DIAGNOSIS — C4492 Squamous cell carcinoma of skin, unspecified: Secondary | ICD-10-CM | POA: Diagnosis not present

## 2024-05-20 DIAGNOSIS — C7989 Secondary malignant neoplasm of other specified sites: Secondary | ICD-10-CM

## 2024-05-20 LAB — RAD ONC ARIA SESSION SUMMARY
Course Elapsed Days: 27
Plan Fractions Treated to Date: 21
Plan Prescribed Dose Per Fraction: 2 Gy
Plan Total Fractions Prescribed: 35
Plan Total Prescribed Dose: 70 Gy
Reference Point Dosage Given to Date: 42 Gy
Reference Point Session Dosage Given: 2 Gy
Session Number: 21

## 2024-05-20 MED ORDER — HYDROCODONE-ACETAMINOPHEN 7.5-325 MG/15ML PO SOLN: 10.0000 mL | ORAL | 0 refills | Status: AC | PRN

## 2024-05-21 ENCOUNTER — Other Ambulatory Visit: Payer: Self-pay

## 2024-05-21 ENCOUNTER — Inpatient Hospital Stay: Payer: PRIVATE HEALTH INSURANCE | Admitting: Nutrition

## 2024-05-21 ENCOUNTER — Ambulatory Visit
Admission: RE | Admit: 2024-05-21 | Discharge: 2024-05-21 | Disposition: A | Discharge status: Home / Self Care | Payer: PRIVATE HEALTH INSURANCE | Source: Ambulatory Visit | Attending: Radiation Oncology | Admitting: Radiation Oncology

## 2024-05-21 DIAGNOSIS — C4492 Squamous cell carcinoma of skin, unspecified: Secondary | ICD-10-CM | POA: Diagnosis not present

## 2024-05-21 LAB — RAD ONC ARIA SESSION SUMMARY
Course Elapsed Days: 28
Plan Fractions Treated to Date: 22
Plan Prescribed Dose Per Fraction: 2 Gy
Plan Total Fractions Prescribed: 35
Plan Total Prescribed Dose: 70 Gy
Reference Point Dosage Given to Date: 44 Gy
Reference Point Session Dosage Given: 2 Gy
Session Number: 22

## 2024-05-21 NOTE — Progress Notes (Signed)
 Telephone follow up completed with patient receiving radiation for squamous cell cancer of the right neck from an unknown primary. Final radiation therapy is scheduled for December 16. She is followed by Dr. Izell.  Weight improved to 168 pounds on November 24 from 166.6 pounds on November 17.  No new labs.  Patient has struggled with tolerating protein shakes. She is not a big milk drinker and does not like the thickness of the shakes. She continues to try different shakes and strategies. She finds it is helpful for her to drink small amounts of the shakes throughout the day. She has tried clear protein drinks but doesn't care for them. Food smells are bothersome and tends to cause her to eat less.  Nutrition Diagnosis: Food and Nutrition Related Knowledge Deficit improved  Intervention: Reviewed ways to add protein to foods she currently enjoys/tolerates. Continue to try various strategies to drink a protein shake if possible. Educated on strategies to reduce food smells.  Monitoring, Evaluation, Goals: Tolerate adequate calories and protein to minimize weight loss.  Next Visit: December 2 by telephone with Elvie.

## 2024-05-22 ENCOUNTER — Other Ambulatory Visit: Payer: Self-pay

## 2024-05-22 ENCOUNTER — Ambulatory Visit
Admission: RE | Admit: 2024-05-22 | Discharge: 2024-05-22 | Disposition: A | Discharge status: Home / Self Care | Payer: PRIVATE HEALTH INSURANCE | Source: Ambulatory Visit | Attending: Radiation Oncology | Admitting: Radiation Oncology

## 2024-05-22 DIAGNOSIS — C4492 Squamous cell carcinoma of skin, unspecified: Secondary | ICD-10-CM | POA: Diagnosis not present

## 2024-05-22 LAB — RAD ONC ARIA SESSION SUMMARY
Course Elapsed Days: 29
Plan Fractions Treated to Date: 23
Plan Prescribed Dose Per Fraction: 2 Gy
Plan Total Fractions Prescribed: 35
Plan Total Prescribed Dose: 70 Gy
Reference Point Dosage Given to Date: 46 Gy
Reference Point Session Dosage Given: 2 Gy
Session Number: 23

## 2024-05-27 ENCOUNTER — Other Ambulatory Visit: Payer: Self-pay

## 2024-05-27 ENCOUNTER — Ambulatory Visit
Admission: RE | Admit: 2024-05-27 | Discharge: 2024-05-27 | Disposition: A | Discharge status: Home / Self Care | Payer: PRIVATE HEALTH INSURANCE | Source: Ambulatory Visit | Attending: Radiation Oncology | Admitting: Radiation Oncology

## 2024-05-27 DIAGNOSIS — C760 Malignant neoplasm of head, face and neck: Secondary | ICD-10-CM | POA: Diagnosis present

## 2024-05-27 DIAGNOSIS — C7989 Secondary malignant neoplasm of other specified sites: Secondary | ICD-10-CM | POA: Insufficient documentation

## 2024-05-27 DIAGNOSIS — C4492 Squamous cell carcinoma of skin, unspecified: Secondary | ICD-10-CM | POA: Insufficient documentation

## 2024-05-27 LAB — RAD ONC ARIA SESSION SUMMARY
Course Elapsed Days: 34
Plan Fractions Treated to Date: 24
Plan Prescribed Dose Per Fraction: 2 Gy
Plan Total Fractions Prescribed: 35
Plan Total Prescribed Dose: 70 Gy
Reference Point Dosage Given to Date: 48 Gy
Reference Point Session Dosage Given: 2 Gy
Session Number: 24

## 2024-05-28 ENCOUNTER — Other Ambulatory Visit: Payer: Self-pay

## 2024-05-28 ENCOUNTER — Ambulatory Visit
Admission: RE | Admit: 2024-05-28 | Discharge: 2024-05-28 | Disposition: A | Discharge status: Home / Self Care | Payer: PRIVATE HEALTH INSURANCE | Source: Ambulatory Visit | Attending: Radiation Oncology | Admitting: Radiation Oncology

## 2024-05-28 ENCOUNTER — Telehealth: Payer: Self-pay | Admitting: Dietician

## 2024-05-28 ENCOUNTER — Inpatient Hospital Stay: Payer: PRIVATE HEALTH INSURANCE | Attending: Radiation Oncology | Admitting: Dietician

## 2024-05-28 DIAGNOSIS — C4492 Squamous cell carcinoma of skin, unspecified: Secondary | ICD-10-CM | POA: Diagnosis not present

## 2024-05-28 LAB — RAD ONC ARIA SESSION SUMMARY
Course Elapsed Days: 35
Plan Fractions Treated to Date: 25
Plan Prescribed Dose Per Fraction: 2 Gy
Plan Total Fractions Prescribed: 35
Plan Total Prescribed Dose: 70 Gy
Reference Point Dosage Given to Date: 50 Gy
Reference Point Session Dosage Given: 2 Gy
Session Number: 25

## 2024-05-28 NOTE — Telephone Encounter (Signed)
 Nutrition Follow-up:  Pt with squamous cell cancer of the right neck from an unknown primary. She is followed by Dr. Izell. She is receiving radiation therapy. Final treatment scheduled for December 16.   Spoke with patient via telephone. Doing well overall. She is unable to taste. Denies sore throat. Noticing a change in how roof of mouth feels, however no associated pain. Patient continues tolerating soft foods (mashed potatoes, mac/cheese, sourdough bread/butter, chicken noodle soup, baked potatoes, grilled cheese). She is trying to drink CIB with milk 1-2/day. Patient reports drinking lots of water  and has one ginger ale that she sips on.   Patient relates wt loss to bout of diarrhea on Sunday. Patient had several episodes through out the day and did not eat afterwards. Says she waited to try po until getting home from treatment on Monday. Diarrhea has resolved.   Medications: reviewed   Labs: no new labs   Anthropometrics: Last wt 163.2 lb on 12/1  11/24 - 168 lb  11/17 0 166.6 lb    NUTRITION DIAGNOSIS: Food and nutrition related knowledge deficit improving    INTERVENTION:  Continue strategies for increasing calories and protein with small frequent meals/snacks Offered suggestions on adding calories/protein to foods (melting cheese on sourdough, adding mayo to grilled cheese, switching to fairlife whole milk, coconut rice)     MONITORING, EVALUATION, GOAL: wt trends, intake   NEXT VISIT: Tuesday December 9 via telephone (pt aware)

## 2024-05-29 ENCOUNTER — Other Ambulatory Visit: Payer: Self-pay

## 2024-05-29 ENCOUNTER — Telehealth (INDEPENDENT_AMBULATORY_CARE_PROVIDER_SITE_OTHER): Payer: Self-pay

## 2024-05-29 ENCOUNTER — Ambulatory Visit
Admission: RE | Admit: 2024-05-29 | Discharge: 2024-05-29 | Disposition: A | Discharge status: Home / Self Care | Payer: PRIVATE HEALTH INSURANCE | Source: Ambulatory Visit | Attending: Radiation Oncology | Admitting: Radiation Oncology

## 2024-05-29 DIAGNOSIS — C4492 Squamous cell carcinoma of skin, unspecified: Secondary | ICD-10-CM | POA: Diagnosis not present

## 2024-05-29 LAB — RAD ONC ARIA SESSION SUMMARY
Course Elapsed Days: 36
Plan Fractions Treated to Date: 26
Plan Prescribed Dose Per Fraction: 2 Gy
Plan Total Fractions Prescribed: 35
Plan Total Prescribed Dose: 70 Gy
Reference Point Dosage Given to Date: 52 Gy
Reference Point Session Dosage Given: 2 Gy
Session Number: 26

## 2024-05-29 NOTE — Therapy (Signed)
 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   Hyperlipidemia    Hypertension    Neck mass 12/2023   right side neck   Pneumonia    x 1   Vitamin D deficiency    Past Surgical History:  Procedure Laterality Date   BREAST EXCISIONAL BIOPSY Right 1978   COLONOSCOPY  10/2021   in [place]   DIRECT LARYNGOSCOPY Bilateral 03/27/2024   Procedure: LARYNGOSCOPY, DIRECT;  Surgeon: [name], MD;  Location: [facility] OR;  Service: ENT;  Laterality: Bilateral;   IR US  GUIDE BX ASP/DRAIN  03/21/2024   LAPAROSCOPY     x 2 for endometriosis   REVERSE SHOULDER ARTHROPLASTY Left 05/29/2019   Procedure: REVERSE SHOULDER ARTHROPLASTY;  Surgeon: Cristy Bonner DASEN, MD;  Location: [facility] ORS;  Service: Orthopedics;  Laterality: Left;   right shoulder arthroscopy Right    TMJ ARTHROSCOPY     TONSILLECTOMY Right 03/27/2024   Procedure: TONSILLECTOMY, PARTIAL;  Surgeon: [name], MD;  Location: [facility] OR;  Service: ENT;  Laterality: Right;   Patient Active Problem List   Diagnosis Date Noted   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) 03/27/2024   Secondary malignant neoplasm of cervical lymph node (HCC) 03/15/2024   Head and neck cancer (HCC) 03/15/2024   Shoulder dislocation 05/27/2019   Hyperlipidemia 06/14/2016   Hypertension 06/14/2016    ONSET DATE: See  pertinent information below   REFERRING DIAG:  C77.0 (ICD-10-CM) - Secondary malignant neoplasm of cervical lymph node (HCC)  C44.92,C79.89 (ICD-10-CM) - 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 01/14/24 She presented to the ED with right sided facial swelling. A CT neck was performed which showed swelling and heterogeneous enhancement of the right parotid gland suggestive of parotitis along with adjacent edema within the upper neck and tracking along the mylohyoid muscle on the right, and a mildly enlarged right level II cystic/necrotic lymph node. Other findings of potential clinical significance included bilateral maxillary sinus mucosal thickening (mild on the right and moderate on the left), and cervical and thoracic spondylosis. Her imaging findings were favored to be infectious in etiology and she was prescribed a course of abx and anti-inflammatories at discharge. She relayed her ED visit to her PCP recommended an interval repeat imaging of the neck. 02/05/24 CT neck demonstrated: the enlarged right level 2A cervical node  measuring 1.4 x 1.3 x 1.6 cm with central hypoattenuation, concerning for central necrosis, and subtle increased density of the right parotid gland without associated inflammatory stranding or mass present. Imaging otherwise showed no masses or abnormal enhancements along the aerodigestive structures. She was referred to ENT. 02/19/24 She saw Dr. Tobie. Her right sided facial swelling had resolved by the time of this visit and she denied any head or neck symptoms overall. Physical examination  performed during this visit however confirmed a slightly palpable right level 2 neck mass. No other abnormalities were appreciated on examination. A laryngoscopy was also performed at that time which was unremarkable other than slight prominence of the right lingual tonsil. 02/21/24 US  guided biopsy of right neck. Pathology showed findings consistent with SCC, p 16 +.03/06/24 PET demonstrated: the right level IIA lymph node measuring 1.5 cm compatible with malignancy, and mildly asymmetric activity along the right palatoglossal arch/anterior pillar of fauces. No other asymmetric findings or significant activity was demonstrated elsewhere in the body to suggest a primary site. 03/15/24 Consult with Dr. Izell. She will receive radiation only. 03/27/24 Direct Laryngoscopy biopsy with Dr. Tobie which showed SCC to her right glossotonsillar sulcus. Treatment plan: She will receive 35 fractions of radiation to her Oropharynx and bilateral neck. She started on 04/23/24 and will complete 06/11/24.  PAIN:  Are you having pain? No  FALLS: Has patient fallen in last 6 months?  No   PATIENT GOALS: Maintain swallowing  OBJECTIVE:  Note: Objective measures were completed at Evaluation unless otherwise noted.                                                                                                                             TREATMENT DATE:  Lesleigh Tuck Against Resistance=CTAR  05/30/24: Soft solids are easier to consume than protein shakes due to gagging with protein shakes. See S - she politely refused solids. No overt s/sx oropharyngeal deficits reported with solids; Today pt drank water  without any oral deficits or s/sx pharyngeal deficits. She told SLP rationale for HEP with mod I. She had question if she was completing Mendelsohn correctly and SLP palpated pt's thyroid during 2 reps of Mendelsohn to confirm pt was completing correctly. She is performing 15-20/day with effortful, Masako, and Mendelsohn,  and is only able to complete Shaker with 15-20 second hold times, but pt is completing more than recommended reps for this/day. SLP stressed at LEAST 20 reps/day of those exercises that she has to swallow for. SLP taught pt CTAR today to substitute for Shaker if desired, or to add to regimen if desired. SLP reiterated education last session re: pt will need to do what she can with HEP if it becomes hard to swallow until next session.   05/02/24: Research states the risk for dysphagia increases due to radiation and/or chemotherapy treatment due to a variety of factors, so SLP educated the pt about the possibility of reduced/limited ability for PO intake during  rad tx. SLP also educated pt regarding possible changes to swallowing musculature after rad tx, and why adherence to dysphagia HEP provided today and PO consumption was necessary to inhibit muscle fibrosis following rad tx and to mitigate muscle disuse atrophy. SLP informed pt why this would be detrimental to their swallowing status and to their pulmonary health. Pt demonstrated understanding of these things to SLP. SLP encouraged pt to safely eat and drink as deep into their radiation/chemotherapy as possible to provide the best possible long-term swallowing outcome for pt.  SLP then developed an individualized HEP for pt involving oral and pharyngeal strengthening and ROM and pt was instructed how to perform these exercises, including SLP demonstration. After SLP demonstration, pt return demonstrated each exercise. SLP ensured pt performance was correct prior to educating pt on next exercise. Pt required usual mod cues faded to modified independent to perform HEP. Pt was instructed to complete this program 5-7 days/week, at least 20 reps a day until 6 months after his or her last day of rad tx, and then x2 a week after that, indefinitely. Among other modifications for days when pt cannot functionally swallow, SLP also suggested pt to perform only  non-swallowing tasks on the handout/HEP, and if necessary to cycle through the swallowing portion so the full program of exercises can be completed instead of fatiguing on one of the swallowing exercises and being unable to perform the other swallowing exercises. SLP instructed that swallowing exercises should then be added back into the regimen as pt is able to do so.   PATIENT EDUCATION: Education details: late effects head/neck radiation on swallow function, HEP procedure, and modification to HEP when difficulty experienced with swallowing during and after radiation course Person educated: Patient Education method: Explanation, Demonstration, and Verbal cues Education comprehension: verbalized understanding, returned demonstration, verbal cues required, and needs further education   ASSESSMENT:  CLINICAL IMPRESSION: Patient is a 73 y.o. F who was seen today for treatment of swallowing as they undergo radiation/chemoradiation therapy. Today pt declined any solid food due to nausea  but drank thin liquids without overt s/s oral or pharyngeal difficulty. She does not report any difficulty with eating solids as long as she takes smaller bites. At this time pt swallowing is deemed WNL/WFL with these POs, when utilizing smaller bite sizes like she is currently. There are no overt s/s aspiration PNA observed by SLP nor any reported by pt at this time. Data indicate that pt's swallow ability will likely decrease over the course of radiation/chemoradiation therapy and could very well decline over time following the conclusion of that therapy due to muscle disuse atrophy and/or muscle fibrosis. Pt will cont to need to be seen by SLP in order to assess safety of PO intake, assess the need for recommending any objective swallow assessment, and ensuring pt is correctly completing the individualized HEP.  OBJECTIVE IMPAIRMENTS: include dysphagia. These impairments are limiting patient from safety when  swallowing. Factors affecting potential to achieve goals and functional outcome are none noted today. Patient will benefit from skilled SLP services to address above impairments and improve overall function.   REHAB POTENTIAL: Good     GOALS: Goals reviewed with patient? No   SHORT TERM GOALS: Target: 3rd total session   1. Pt will complete HEP with modified independence in 2 sessions Baseline: Goal status: INITIAL   2.  pt will tell SLP why pt is completing HEP with modified independence Baseline:  Goal status: met   3.  pt  will describe 3 overt s/s aspiration PNA with modified independence Baseline:  Goal status: INITIAL   4.  pt will tell SLP how a food journal could hasten return to a more normalized diet Baseline:  Goal status: INITIAL     LONG TERM GOALS: Target: 7th total session   1.  pt will complete HEP with independence over two visits Baseline:  Goal status: INITIAL   2.  pt will describe how to modify HEP over time, and the timeline associated with reduction in HEP frequency with modified independence over two sessions Baseline:  Goal status: INITIAL     PLAN:   SLP FREQUENCY:  once approx every 4 weeks   SLP DURATION:  7 sessions   PLANNED INTERVENTIONS: Aspiration precaution training, Pharyngeal strengthening exercises, Diet toleration management , Trials of upgraded texture/liquids, SLP instruction and feedback, Compensatory strategies, and Patient/family education, [phone] (treatment of swallowing dysfunction and/or oral function for feeding)   Chriss Redel, CCC-SLP 05/30/2024, 9:02 AM

## 2024-05-29 NOTE — Telephone Encounter (Signed)
 Patient called stating she filled out paperwork a couple weeks ago requesting medical records for patients insurance. I spoke with Randine to see if she had an update for patient. Randine stated she sent it off on 05/15/2024 and that they may have billed patient for the medical records and she needs to pay first. Randine also stated that she would call to check on the status for sure and find out what the patient is waiting on and then call the patient back to give her the update. I explained to the patient and she understood.

## 2024-05-30 ENCOUNTER — Other Ambulatory Visit: Payer: Self-pay

## 2024-05-30 ENCOUNTER — Ambulatory Visit
Admission: RE | Admit: 2024-05-30 | Discharge: 2024-05-30 | Disposition: A | Discharge status: Home / Self Care | Payer: PRIVATE HEALTH INSURANCE | Source: Ambulatory Visit | Attending: Radiation Oncology | Admitting: Radiation Oncology

## 2024-05-30 ENCOUNTER — Ambulatory Visit: Payer: PRIVATE HEALTH INSURANCE

## 2024-05-30 DIAGNOSIS — C4492 Squamous cell carcinoma of skin, unspecified: Secondary | ICD-10-CM | POA: Diagnosis not present

## 2024-05-30 DIAGNOSIS — R1310 Dysphagia, unspecified: Secondary | ICD-10-CM | POA: Diagnosis present

## 2024-05-30 LAB — RAD ONC ARIA SESSION SUMMARY
Course Elapsed Days: 37
Plan Fractions Treated to Date: 27
Plan Prescribed Dose Per Fraction: 2 Gy
Plan Total Fractions Prescribed: 35
Plan Total Prescribed Dose: 70 Gy
Reference Point Dosage Given to Date: 54 Gy
Reference Point Session Dosage Given: 2 Gy
Session Number: 27

## 2024-05-30 NOTE — Telephone Encounter (Signed)
 Spoke with Garrel at [facility].  He stated that the medical records were emailed to the patient on 05/21/2024.  I called the patient and LVM relaying this information.

## 2024-05-31 ENCOUNTER — Ambulatory Visit
Admission: RE | Admit: 2024-05-31 | Discharge: 2024-05-31 | Disposition: A | Discharge status: Home / Self Care | Payer: PRIVATE HEALTH INSURANCE | Source: Ambulatory Visit | Attending: Radiation Oncology | Admitting: Radiation Oncology

## 2024-05-31 ENCOUNTER — Other Ambulatory Visit: Payer: Self-pay

## 2024-05-31 DIAGNOSIS — C4492 Squamous cell carcinoma of skin, unspecified: Secondary | ICD-10-CM | POA: Diagnosis not present

## 2024-05-31 LAB — RAD ONC ARIA SESSION SUMMARY
Course Elapsed Days: 38
Plan Fractions Treated to Date: 28
Plan Prescribed Dose Per Fraction: 2 Gy
Plan Total Fractions Prescribed: 35
Plan Total Prescribed Dose: 70 Gy
Reference Point Dosage Given to Date: 56 Gy
Reference Point Session Dosage Given: 2 Gy
Session Number: 28

## 2024-06-03 ENCOUNTER — Ambulatory Visit
Admission: RE | Admit: 2024-06-03 | Discharge: 2024-06-03 | Disposition: A | Discharge status: Home / Self Care | Payer: PRIVATE HEALTH INSURANCE | Source: Ambulatory Visit | Attending: Radiation Oncology | Admitting: Radiation Oncology

## 2024-06-03 ENCOUNTER — Other Ambulatory Visit: Payer: Self-pay

## 2024-06-03 DIAGNOSIS — C4492 Squamous cell carcinoma of skin, unspecified: Secondary | ICD-10-CM | POA: Diagnosis not present

## 2024-06-03 LAB — RAD ONC ARIA SESSION SUMMARY
Course Elapsed Days: 41
Plan Fractions Treated to Date: 29
Plan Prescribed Dose Per Fraction: 2 Gy
Plan Total Fractions Prescribed: 35
Plan Total Prescribed Dose: 70 Gy
Reference Point Dosage Given to Date: 58 Gy
Reference Point Session Dosage Given: 2 Gy
Session Number: 29

## 2024-06-04 ENCOUNTER — Other Ambulatory Visit: Payer: Self-pay

## 2024-06-04 ENCOUNTER — Telehealth: Payer: Self-pay | Admitting: Dietician

## 2024-06-04 ENCOUNTER — Inpatient Hospital Stay: Payer: PRIVATE HEALTH INSURANCE | Admitting: Dietician

## 2024-06-04 ENCOUNTER — Ambulatory Visit
Admission: RE | Admit: 2024-06-04 | Discharge: 2024-06-04 | Disposition: A | Discharge status: Home / Self Care | Payer: PRIVATE HEALTH INSURANCE | Source: Ambulatory Visit | Attending: Radiation Oncology | Admitting: Radiation Oncology

## 2024-06-04 DIAGNOSIS — C4492 Squamous cell carcinoma of skin, unspecified: Secondary | ICD-10-CM | POA: Diagnosis not present

## 2024-06-04 LAB — RAD ONC ARIA SESSION SUMMARY
Course Elapsed Days: 42
Plan Fractions Treated to Date: 30
Plan Prescribed Dose Per Fraction: 2 Gy
Plan Total Fractions Prescribed: 35
Plan Total Prescribed Dose: 70 Gy
Reference Point Dosage Given to Date: 60 Gy
Reference Point Session Dosage Given: 2 Gy
Session Number: 30

## 2024-06-04 NOTE — Telephone Encounter (Signed)
 Nutrition Follow-up:  Pt with squamous cell cancer of the right neck from an unknown primary. She is followed by Dr. Izell. She is receiving radiation therapy. Final treatment scheduled for December 16.   Spoke with patient via telephone. Reports po is becoming challenging. Eating a few bites about every 1 1/2 hours. Finds food more tolerable in small amounts given altered taste. Patient was able to taste chinese noodles last night for dinner. Was able to eat more and states that was the most she had eaten in a couple weeks. Patient has had jello and 4 slices of sourdough with butter and ham today. Reports protein shakes are harder to get down due to thick saliva. She is doing baking soda salt water  gargles. Patient drinking lots of water  as well as ginger ale. Reports her stomach was iffy on Friday which resolved after laying down. Patient denies vomiting, diarrhea, constipation.   Medications: reviewed   Labs: no new labs   Anthropometrics: Last wt 162 lb redia) on 12/8  12/1 - 163.2 lb  11/24 - 168 lb  11/17 - 166.6 lb    NUTRITION DIAGNOSIS: Food and nutrition related knowledge deficit improved   INTERVENTION:  Continue strategies for increasing calories and protein with small frequent meals/snacks Encourage high calorie high protein foods Suggested trying ginger ale for thick saliva Continue baking soda salt water  gargles     MONITORING, EVALUATION, GOAL: wt trends, intake    NEXT VISIT: Tuesday December 16 during infusion

## 2024-06-05 ENCOUNTER — Ambulatory Visit
Admission: RE | Admit: 2024-06-05 | Discharge: 2024-06-05 | Disposition: A | Discharge status: Home / Self Care | Payer: PRIVATE HEALTH INSURANCE | Source: Ambulatory Visit | Attending: Radiation Oncology | Admitting: Radiation Oncology

## 2024-06-05 ENCOUNTER — Other Ambulatory Visit: Payer: Self-pay

## 2024-06-05 DIAGNOSIS — C4492 Squamous cell carcinoma of skin, unspecified: Secondary | ICD-10-CM | POA: Diagnosis not present

## 2024-06-05 LAB — RAD ONC ARIA SESSION SUMMARY
Course Elapsed Days: 43
Plan Fractions Treated to Date: 31
Plan Prescribed Dose Per Fraction: 2 Gy
Plan Total Fractions Prescribed: 35
Plan Total Prescribed Dose: 70 Gy
Reference Point Dosage Given to Date: 62 Gy
Reference Point Session Dosage Given: 2 Gy
Session Number: 31

## 2024-06-06 ENCOUNTER — Other Ambulatory Visit: Payer: Self-pay

## 2024-06-06 ENCOUNTER — Ambulatory Visit
Admission: RE | Admit: 2024-06-06 | Discharge: 2024-06-06 | Disposition: A | Discharge status: Home / Self Care | Payer: PRIVATE HEALTH INSURANCE | Source: Ambulatory Visit | Attending: Radiation Oncology | Admitting: Radiation Oncology

## 2024-06-06 DIAGNOSIS — C4492 Squamous cell carcinoma of skin, unspecified: Secondary | ICD-10-CM | POA: Diagnosis not present

## 2024-06-06 LAB — RAD ONC ARIA SESSION SUMMARY
Course Elapsed Days: 44
Plan Fractions Treated to Date: 32
Plan Prescribed Dose Per Fraction: 2 Gy
Plan Total Fractions Prescribed: 35
Plan Total Prescribed Dose: 70 Gy
Reference Point Dosage Given to Date: 64 Gy
Reference Point Session Dosage Given: 2 Gy
Session Number: 32

## 2024-06-07 ENCOUNTER — Other Ambulatory Visit: Payer: Self-pay

## 2024-06-07 ENCOUNTER — Ambulatory Visit
Admission: RE | Admit: 2024-06-07 | Discharge: 2024-06-07 | Disposition: A | Discharge status: Home / Self Care | Payer: PRIVATE HEALTH INSURANCE | Source: Ambulatory Visit | Attending: Radiation Oncology | Admitting: Radiation Oncology

## 2024-06-07 DIAGNOSIS — C4492 Squamous cell carcinoma of skin, unspecified: Secondary | ICD-10-CM | POA: Diagnosis not present

## 2024-06-07 LAB — RAD ONC ARIA SESSION SUMMARY
Course Elapsed Days: 45
Plan Fractions Treated to Date: 33
Plan Prescribed Dose Per Fraction: 2 Gy
Plan Total Fractions Prescribed: 35
Plan Total Prescribed Dose: 70 Gy
Reference Point Dosage Given to Date: 66 Gy
Reference Point Session Dosage Given: 2 Gy
Session Number: 33

## 2024-06-10 ENCOUNTER — Ambulatory Visit
Admission: RE | Admit: 2024-06-10 | Discharge: 2024-06-10 | Disposition: A | Discharge status: Home / Self Care | Payer: PRIVATE HEALTH INSURANCE | Source: Ambulatory Visit | Attending: Radiation Oncology | Admitting: Radiation Oncology

## 2024-06-10 ENCOUNTER — Other Ambulatory Visit: Payer: Self-pay

## 2024-06-10 DIAGNOSIS — C4492 Squamous cell carcinoma of skin, unspecified: Secondary | ICD-10-CM | POA: Diagnosis not present

## 2024-06-10 LAB — RAD ONC ARIA SESSION SUMMARY
Course Elapsed Days: 48
Plan Fractions Treated to Date: 34
Plan Prescribed Dose Per Fraction: 2 Gy
Plan Total Fractions Prescribed: 35
Plan Total Prescribed Dose: 70 Gy
Reference Point Dosage Given to Date: 68 Gy
Reference Point Session Dosage Given: 2 Gy
Session Number: 34

## 2024-06-11 ENCOUNTER — Inpatient Hospital Stay: Payer: PRIVATE HEALTH INSURANCE | Admitting: Dietician

## 2024-06-11 ENCOUNTER — Other Ambulatory Visit: Payer: Self-pay

## 2024-06-11 ENCOUNTER — Telehealth: Payer: Self-pay | Admitting: Dietician

## 2024-06-11 ENCOUNTER — Ambulatory Visit
Admission: RE | Admit: 2024-06-11 | Discharge: 2024-06-11 | Disposition: A | Discharge status: Home / Self Care | Payer: PRIVATE HEALTH INSURANCE | Source: Ambulatory Visit | Attending: Radiation Oncology | Admitting: Radiation Oncology

## 2024-06-11 DIAGNOSIS — C4492 Squamous cell carcinoma of skin, unspecified: Secondary | ICD-10-CM | POA: Diagnosis not present

## 2024-06-11 LAB — RAD ONC ARIA SESSION SUMMARY
Course Elapsed Days: 49
Plan Fractions Treated to Date: 35
Plan Prescribed Dose Per Fraction: 2 Gy
Plan Total Fractions Prescribed: 35
Plan Total Prescribed Dose: 70 Gy
Reference Point Dosage Given to Date: 70 Gy
Reference Point Session Dosage Given: 2 Gy
Session Number: 35

## 2024-06-11 NOTE — Progress Notes (Signed)
 Oncology Nurse Navigator Documentation   Met with Ms. Fountaine after final RT to offer support and to celebrate end of radiation treatment.   Provided verbal/written post-RT guidance: Importance of keeping all follow-up appts, especially those with Nutrition and SLP. Importance of protecting treatment area from sun. Continuation of Sonafine application 2-3 times daily, application of antibiotic ointment to areas of raw skin; when supply of Sonafine exhausted transition to OTC lotion with vitamin E.  Explained my role as navigator will continue for several more months, encouraged him to call me with needs/concerns.    Delon Jefferson RN, BSN, OCN Head & Neck Oncology Nurse Navigator [facility] at [facility] Phone # [phone]  Fax # [phone]

## 2024-06-11 NOTE — Telephone Encounter (Signed)
 Nutrition Follow-up:  Pt with squamous cell cancer of the right neck from an unknown primary. She is followed by Dr. Izell. She is receiving radiation therapy. Final treatment scheduled for December 16.   Spoke with patient via telephone. She completed final radiation this morning. Patient stopped at [employer] on the way home. Says she was able to taste the sweet tea which surprised her. Patient continues eating frequent small meals. Had buttered toast prior to treatment. Recalls scrambled eggs when she got home. Saliva is thick. Baking soda salt water  gargles are somewhat helpful.   Medications: reviewed   Labs: no new labs   Anthropometrics: Last wt 163.2 lb on 12/15 (aria)  12/8 - 162 lb  12/1 - 163.2 lb  11/24 - 168 lb  11/17 - 166.6 lb    NUTRITION DIAGNOSIS: Food and nutrition related knowledge deficit resolved    INTERVENTION:  Congratulated pt on completing treatment  Continue strategies for increasing calories and protein with small frequent meals/snacks  Educated on ongoing increased needs to support post treatment healing  Continue CIB as tolerated Continue baking soda salt water  gargles     MONITORING, EVALUATION, GOAL: wt trends, intake   NEXT VISIT: To be scheduled as needed. Patient has contact information. Encouraged to call with nutrition questions/concerns

## 2024-06-12 ENCOUNTER — Ambulatory Visit: Payer: PRIVATE HEALTH INSURANCE

## 2024-06-13 NOTE — Radiation Completion Notes (Signed)
 Patient Name: Susan Snow, JAVIER MRN: [id number] Date of Birth: 09/07/1950 Referring Physician: ELDORA BLANCH, M.D. Date of Service: 2024-06-13 Radiation Oncologist: Lauraine Golden, M.D. 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 Staging on 2024-03-15: Head and neck cancer (HCC) T=cT0, N=cN1, M=cM0 Intent: Curative     ==========DELIVERED PLANS==========  First Treatment Date: 2024-04-23 Last Treatment Date: 2024-06-11   Plan Name: HN_GlosTon Site: Oropharynx Technique: IMRT Mode: Photon Dose Per Fraction: 2 Gy Prescribed Dose (Delivered / Prescribed): 70 Gy / 70 Gy Prescribed Fxs (Delivered / Prescribed): 35 / 35     ==========ON TREATMENT VISIT DATES========== 2024-04-29, 2024-05-06, 2024-05-13, 2024-05-15, 2024-05-20, 2024-05-27, 2024-06-03, 2024-06-10     ==========UPCOMING VISITS========== 07/02/2024 [facility] TREATMENT Jacelyn Lupita KATHEE RAEJEAN  07/02/2024 [facility] REH AT [address] [place], Florina CROME, PT  06/18/2024 [facility]-RADIATION ONC FOLLOW UP [address] Wy[address] Czar M, [place]        ==========APPENDIX - ON TREATMENT VISIT NOTES==========   See weekly On Treatment Notes in Epic for details in the Media tab (listed as Progress notes on the On Treatment Visit Dates listed above).

## 2024-06-14 NOTE — Progress Notes (Signed)
 " Radiation Oncology         [phone] ________________________________  Name: Susan Snow MRN: [id number]  Date: 06/18/2024  DOB: 01/22/1951  Follow-Up Visit Note  CC: Debrah Josette ORN., PA-C  Debrah Josette ORN., PA-C  Diagnosis and Prior Radiotherapy:    No diagnosis found. ***   Cancer Staging  Head and neck cancer (HCC) Staging form: Cervical Lymph Nodes and Unknown Primary Tumors of the Head and Neck, AJCC 8th Edition - Clinical stage from 03/15/2024: Stage III (cT0, cN1, cM0) - Unsigned Stage prefix: Initial diagnosis  ==========DELIVERED PLANS==========  First Treatment Date: 2024-04-23 Last Treatment Date: 2024-06-11   Plan Name: HN_GlosTon Site: Oropharynx Technique: IMRT Mode: Photon Dose Per Fraction: 2 Gy Prescribed Dose (Delivered / Prescribed): 70 Gy / 70 Gy Prescribed Fxs (Delivered / Prescribed): 35 / 35   Stage III (cT0, N1, M0) squamous cell carcinoma of right neck from an unknown primary, p16 positive; s/p definitive radiation completed on 06/11/2024  CHIEF COMPLAINT:  Here for follow-up and surveillance of oropharyngeal cancer  Narrative:  The patient returns today for routine follow-up.  She completed her treatment approximately 1 week ago  ***                    ALLERGIES:  is allergic to olmesartan .  Meds: Current Outpatient Medications  Medication Sig Dispense Refill   acetaminophen  (TYLENOL ) 500 MG tablet Take 2 tablets (1,000 mg total) by mouth every 6 (six) hours. 100 tablet 2   aspirin EC 81 MG tablet Take 1 tablet (81 mg total) by mouth every morning.     Calcium Carb-Cholecalciferol 600-5 MG-MCG TABS Take 1 tablet by mouth daily.     HYDROcodone -acetaminophen  (HYCET) 7.5-325 mg/15 ml solution Take 10-15 mLs by mouth every 4 (four) hours as needed for severe pain (pain score 7-10). Take with food. 180 mL 0   lidocaine  (XYLOCAINE ) 2 % solution Patient: Mix 1part 2% viscous lidocaine , 1part H20. Swish & swallow 10mL of diluted mixture,  before meals and at bedtime, up to QID 200 mL 3   Multiple Vitamins-Minerals (ADULT ONE DAILY GUMMIES) CHEW Chew 2 tablets by mouth every morning.     olmesartan  (BENICAR ) 20 MG tablet Take 20 mg by mouth every morning.   3   simvastatin (ZOCOR) 40 MG tablet Take 40 mg by mouth at bedtime.   3   Vitamin D, Ergocalciferol, (DRISDOL) 1.25 MG (50000 UNIT) CAPS capsule Take 50,000 Units by mouth once a week.     No current facility-administered medications for this visit.    Physical Findings: The patient is in no acute distress. Patient is alert and oriented. Wt Readings from Last 3 Encounters:  04/15/24 171 lb 3.2 oz (77.7 kg)  04/04/24 171 lb (77.6 kg)  03/27/24 171 lb (77.6 kg)    vitals were not taken for this visit. .  General: Alert and oriented, in no acute distress HEENT: Head is normocephalic. Extraocular movements are intact. Oropharynx is notable for *** Neck: Neck is notable for *** Skin: Skin in treatment fields shows satisfactory healing *** Heart: Regular in rate and rhythm with no murmurs, rubs, or gallops. Chest: Clear to auscultation bilaterally, with no rhonchi, wheezes, or rales. Abdomen: Soft, nontender, nondistended, with no rigidity or guarding. Extremities: No cyanosis or edema. Lymphatics: see Neck Exam Psychiatric: Judgment and insight are intact. Affect is appropriate.   Lab Findings: Lab Results  Component Value Date   WBC 7.5 03/27/2024   HGB 14.8  03/27/2024   HCT 44.2 03/27/2024   MCV 97.8 03/27/2024   PLT 290 03/27/2024    No results found for: TSH  Radiographic Findings: No results found.  Impression/Plan:  Stage III (cT0, N1, M0) squamous cell carcinoma of right neck from an unknown primary, p16 positive; s/p definitive radiation completed on 06/11/2024.  1) Head and Neck Cancer Status: ***  2) Nutritional Status: *** PEG tube: ***  3) Risk Factors: The patient has been educated about risk factors including alcohol and tobacco  abuse; they understand that avoidance of alcohol and tobacco is important to prevent recurrences as well as other cancers  4) Swallowing: ***  5) Dental: Encouraged to continue regular followup with dentistry, and dental hygiene including fluoride rinses. ***  6) Thyroid function: No results found for: TSH  7) Other: ***  8) PET scan in approximately 3 months with a rad onc appointment to follow. The patient was encouraged to call with any issues or questions before then.  On date of service, in total, I spent *** minutes on this encounter. Patient was seen in person. _____________________________________    Leeroy Due, PA-C   "

## 2024-06-17 NOTE — Progress Notes (Signed)
"  ° °  Ms. Susan Snow presents today for a follow-up appointment today- with Leeroy Due PA-C for: Malignant neoplasm of base of tongue. She completed radiation treatment on 06-11-2024.  Treatment Completion Date: *** Pain issues, if any: *** Using a feeding tube?: *** Weight changes, if any: *** 06/18/24 - 12/8 - 162 lb  12/1 - 163.2 lb  11/24 - 168 lb  11/17 - 166.6 lb Swallowing issues, if any: *** Smoking or chewing tobacco? *** Using fluoride toothpaste daily? *** Last ENT visit was on: *** Other notable issues, if any: ***  "

## 2024-06-18 ENCOUNTER — Ambulatory Visit
Admission: RE | Admit: 2024-06-18 | Discharge: 2024-06-18 | Disposition: A | Discharge status: Home / Self Care | Payer: PRIVATE HEALTH INSURANCE | Source: Ambulatory Visit | Attending: Radiology | Admitting: Radiology

## 2024-06-18 ENCOUNTER — Encounter: Payer: Self-pay | Admitting: Radiology

## 2024-06-18 VITALS — BP 119/84 | HR 85 | Temp 97.2°F | Resp 20 | Ht 63.0 in | Wt 156.8 lb

## 2024-06-18 DIAGNOSIS — Z923 Personal history of irradiation: Secondary | ICD-10-CM | POA: Insufficient documentation

## 2024-06-18 DIAGNOSIS — K1230 Oral mucositis (ulcerative), unspecified: Secondary | ICD-10-CM | POA: Diagnosis not present

## 2024-06-18 DIAGNOSIS — C770 Secondary and unspecified malignant neoplasm of lymph nodes of head, face and neck: Secondary | ICD-10-CM | POA: Diagnosis not present

## 2024-06-18 DIAGNOSIS — C01 Malignant neoplasm of base of tongue: Secondary | ICD-10-CM | POA: Diagnosis present

## 2024-06-18 DIAGNOSIS — Z79899 Other long term (current) drug therapy: Secondary | ICD-10-CM | POA: Diagnosis not present

## 2024-06-18 DIAGNOSIS — C4492 Squamous cell carcinoma of skin, unspecified: Secondary | ICD-10-CM

## 2024-06-18 DIAGNOSIS — Z7982 Long term (current) use of aspirin: Secondary | ICD-10-CM | POA: Insufficient documentation

## 2024-06-18 NOTE — Progress Notes (Signed)
 Oncology Nurse Navigator Documentation   I met with Susan Snow during her follow up with Ronita Due PA today. She is slowly recovering from her radiation treatment. She will be seen again in 3 months after her post treatment PET. She knows to call me if she has any concerns or questions before then.   Delon Jefferson RN, BSN, OCN Head & Neck Oncology Nurse Navigator [facility] at [facility] Phone # [phone]  Fax # [phone]

## 2024-07-02 ENCOUNTER — Ambulatory Visit: Payer: PRIVATE HEALTH INSURANCE | Attending: Radiation Oncology

## 2024-07-02 ENCOUNTER — Ambulatory Visit: Payer: PRIVATE HEALTH INSURANCE | Attending: Radiation Oncology | Admitting: Physical Therapy

## 2024-07-02 ENCOUNTER — Encounter: Payer: Self-pay | Admitting: Physical Therapy

## 2024-07-02 DIAGNOSIS — C4492 Squamous cell carcinoma of skin, unspecified: Secondary | ICD-10-CM | POA: Insufficient documentation

## 2024-07-02 DIAGNOSIS — R1310 Dysphagia, unspecified: Secondary | ICD-10-CM | POA: Diagnosis present

## 2024-07-02 DIAGNOSIS — R293 Abnormal posture: Secondary | ICD-10-CM | POA: Insufficient documentation

## 2024-07-02 DIAGNOSIS — C7989 Secondary malignant neoplasm of other specified sites: Secondary | ICD-10-CM | POA: Insufficient documentation

## 2024-07-02 DIAGNOSIS — C770 Secondary and unspecified malignant neoplasm of lymph nodes of head, face and neck: Secondary | ICD-10-CM | POA: Insufficient documentation

## 2024-07-02 NOTE — Patient Instructions (Signed)
" ° °  Start a food journal so you know things to try again, to get back to eating food faster.  Have columns for: date, what you ate, amount you tried, scale 1-5 (5=best) for how easily it went down, and then scale 1-5 (5=best) for taste.   Anything that is a 4 or 5 try to continue to eat. Anything that is a 1 or 2 or 3, wait two weeks before trying again.   ==========================  Signs of Aspiration Pneumonia   Chest pain/tightness Fever (can be low grade) Cough  With foul-smelling phlegm (sputum) With sputum containing pus or blood With greenish sputum Fatigue  Shortness of breath  Wheezing   **IF YOU HAVE THESE SIGNS, CONTACT YOUR DOCTOR OR GO TO THE EMERGENCY DEPARTMENT OR URGENT CARE AS SOON AS POSSIBLE**     "

## 2024-07-02 NOTE — Therapy (Signed)
 " OUTPATIENT PHYSICAL THERAPY HEAD AND NECK POST RADIATION FOLLOW UP   Patient Name: Susan Snow MRN: [id number] DOB:[birth year], 74 y.o., female Today's Date: 07/02/2024  END OF SESSION:  PT End of Session - 07/02/24 0838     Visit Number 2    Number of Visits 2    Date for Recertification  07/11/24    PT Start Time 0804    PT Stop Time 0836    PT Time Calculation (min) 32 min    Activity Tolerance Patient tolerated treatment well    Behavior During Therapy [facility] for tasks assessed/performed          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   Hyperlipidemia    Hypertension    Neck mass 12/2023   right side neck   Pneumonia    x 1   Vitamin D deficiency    Past Surgical History:  Procedure Laterality Date   BREAST EXCISIONAL BIOPSY Right 1978   COLONOSCOPY  10/2021   in [place]   DIRECT LARYNGOSCOPY Bilateral 03/27/2024   Procedure: LARYNGOSCOPY, DIRECT;  Surgeon: [name], MD;  Location: [facility] OR;  Service: ENT;  Laterality: Bilateral;   IR US  GUIDE BX ASP/DRAIN  03/21/2024   LAPAROSCOPY     x 2 for endometriosis   REVERSE SHOULDER ARTHROPLASTY Left 05/29/2019   Procedure: REVERSE SHOULDER ARTHROPLASTY;  Surgeon: Cristy Bonner DASEN, MD;  Location: [facility] ORS;  Service: Orthopedics;  Laterality: Left;   right shoulder arthroscopy Right    TMJ ARTHROSCOPY     TONSILLECTOMY Right 03/27/2024   Procedure: TONSILLECTOMY, PARTIAL;  Surgeon: [name], MD;  Location: [facility] OR;  Service: ENT;  Laterality: Right;   Patient Active Problem List   Diagnosis Date Noted   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 [facility]) 03/27/2024   Secondary malignant neoplasm of cervical lymph node (HCC) 03/15/2024   Head and neck cancer (HCC) 03/15/2024   Shoulder dislocation 05/27/2019   Hyperlipidemia 06/14/2016   Hypertension 06/14/2016    PCP: Josette Aho,  PA-C   REFERRING PROVIDER: Izell Domino, MD   REFERRING DIAG: C77.0 (ICD-10-CM) - Secondary malignant neoplasm of cervical lymph node (HCC) C44.92,C79.89 (ICD-10-CM) - 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 If anything it is my back. My neck does not hurt. I have not noticed any swelling or tightness. I am eating but I still can't taste most things.   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 A CT neck was performed which showed swelling and heterogeneous enhancement of the right parotid gland suggestive of parotitis along with adjacent edema within the upper neck and tracking along the mylohyoid muscle on the right, and a mildly enlarged right level II cystic/necrotic lymph node. Other findings of potential clinical significance included bilateral maxillary sinus mucosal thickening (mild on the right and moderate on the left), and cervical and thoracic spondylosis. Her imaging findings were favored to be infectious in etiology and she was prescribed a course of abx and anti-inflammatories at discharge. She relayed her ED visit to her PCP recommended an interval repeat imaging of the neck. 02/05/24 CT neck demonstrated: the enlarged right level 2A cervical node measuring 1.4 x 1.3 x 1.6 cm with central hypoattenuation,  concerning for central necrosis, and subtle increased density of the right parotid gland without associated inflammatory stranding or mass present. Imaging otherwise showed no masses or abnormal enhancements along the aerodigestive structures. She was referred to ENT. 02/19/24 She saw Dr. Tobie. Her right sided facial swelling had resolved by the time of this visit and she denied any head or neck symptoms overall. Physical examination performed during this visit however confirmed a slightly palpable right level 2 neck mass. No other abnormalities were appreciated on examination. A laryngoscopy was also performed at that time which was unremarkable other than slight prominence of the right lingual tonsil. 02/21/24 US  guided biopsy of right neck. Pathology showed findings consistent with SCC, p 16 +. 03/06/24 PET demonstrated: the right level IIA lymph node measuring 1.5 cm compatible with malignancy, and mildly asymmetric activity along the right palatoglossal arch/anterior pillar of fauces. No other asymmetric findings or significant activity was demonstrated elsewhere in the body to suggest a primary site. 03/27/24 Direct Laryngoscopy biopsy with Dr. Tobie which showed SCC to her right glossotonsillar sulcus. She will receive 35 fractions of radiation to her Oropharynx and bilateral neck. She started on 04/23/24 and will complete 06/11/24.   PATIENT GOALS:  Reassess how my recovery is going related to neck ROM, cervical pain, fatigue, and swelling.  PAIN:  Are you having pain? No  PRECAUTIONS: Recent radiation, Head and neck lymphedema risk, (L reverse shoulder, R rotator cuff repair)   OBJECTIVE:   POSTURE:  Forward head and rounded shoulders posture  30 SEC SIT TO STAND: 07/02/24: 15 reps in 30 sec without use of UEs which is excellent for her age At baseline: 13 reps in 30 sec without use of UEs which is  Good for patient's age   SHOULDER AROM:   WFL     CERVICAL AROM:     Percent limited at  baseline 07/02/24  Flexion 25% limited [facility] WFL  Right lateral flexion 50% limited 25% limited  Left lateral flexion 25% limited 25% limited  Right rotation 25% limited WFL  Left rotation 25% limited WFL                          (Blank rows=not tested)  LYMPHEDEMA ASSESSMENT:    Circumference in cm 07/02/24  4 cm superior to sternal notch around neck 36.8  6 cm superior to sternal notch around neck 36.4  8 cm superior to sternal notch around neck 35.5  R lateral nostril from base of nose to medial tragus   L lateral nostril from base of nose to medial tragus   R corner of mouth to where ear lobe meets face   L corner of mouth to where ear lobe meets face         (Blank rows=not tested)   OTHER SYMPTOMS: Pain No Fibrosis No Pitting edema No Infections No Decreased scar mobility No  PATIENT EDUCATION:  Education details: importance of walking, posture education, need to continue HEP for another 6 months at least Person educated: Patient Education method: Explanation and Handouts Education comprehension: verbalized understanding  HOME EXERCISE PROGRAM: Reviewed previously given post op HEP.   ASSESSMENT:  CLINICAL IMPRESSION: Pt has completed radiation to bilateral neck and oropharynx for treatment of head and neck cancer with no known primary. She has been compliant with her HEP. Her cervial ROM has improved from baseline. She does not demonstrate any signs of lymphedema but baseline circumference measurements were taken. She was re educated about signs of lymphedema and to return if she notices any sweling. She will be discharged from skilled PT services at this time because she has no other skilled needs.   Pt will benefit from skilled therapeutic intervention to improve on the following deficits: decreased knowledge of condition  PT treatment/interventions: ADL/Self care home management, [phone]- PT Re-evaluation, 97110-Therapeutic exercises, 97530-  Therapeutic activity, [id number]- Neuromuscular re-education, 97535- Self Care, [id number]- Manual therapy, 97760- Orthotic Initial, [phone]- Orthotic/Prosthetic subsequent, and Patient/Family education     GOALS: Goals reviewed with patient? Yes  GOALS MET AT EVAL:   Name Target Date  Goal status  1 Patient will be able to verbalize understanding of a home exercise program for cervical range of motion, posture, and walking.   Baseline:  No knowledge Eval Achieved at eval  2 Patient will be able to verbalize understanding of proper sitting and standing posture. Baseline:  No knowledge Eval Achieved at eval  3 Patient will be able to verbalize understanding of lymphedema risk and availability of treatment for this condition Baseline:  No knowledge Eval Achieved at eval     LONG TERM GOALS:  (STG=LTG)  GOALS Name Target Date  Goal status  1 Pt will demonstrate a return to baseline cervical ROM measurements and not demonstrate any signs or symptoms of lymphedema. Baseline: 07/02/24 MET       PLAN:  PT FREQUENCY/DURATION: d/c this session  PLAN FOR NEXT SESSION: d/c this session   [facility]  [address], [address]  [place] [address]  [phone]   Home exercise Program Continue doing the exercises you were given until you feel like you can do them without feeling any tightness at the end. It is best to do them for several months after completion of radiation since the effects of radiation continue past completion.   Walking Program Studies show that 30 minutes of walking per day (fast enough to elevate your heart rate) can significantly reduce the risk of a cancer recurrence. If you can't walk due to other medical reasons, we encourage you to find another activity you could do (like a stationary bike or water  exercise).  Posture After treatment for head and neck cancer, people frequently sit with rounded shoulders and forward head posture because the  front of  the neck has become tight and it feels better. If you sit like this, you can become very tight and have pain in sitting or standing with good posture. Try to be aware of your posture and sit and stand up tall to heal properly.  Follow up PT: Please let you doctor know as soon as possible if you develop any swelling in your face or neck in the future. Lymphedema (swelling) can occur months after completion of radiation. The sooner you can let the doctor know, the sooner they can refer you back to PT. It is much easier to treat the swelling early on.   [facility] [place], PT 07/02/2024, 8:44 AM  PHYSICAL THERAPY DISCHARGE SUMMARY  Visits from Start of Care: 2  Current functional level related to goals / functional outcomes: All goals met   Remaining deficits: None   Education / Equipment: Lymphedema education, posture, HEP, walking program   Patient agrees to discharge. Patient goals were met. Patient is being discharged due to meeting the stated rehab goals.  Florina Sever [place], [place] 07/02/2024 8:44 AM   "

## 2024-07-02 NOTE — Therapy (Signed)
 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   Hyperlipidemia    Hypertension    Neck mass 12/2023   right side neck   Pneumonia    x 1   Vitamin D deficiency    Past Surgical History:  Procedure Laterality Date   BREAST EXCISIONAL BIOPSY Right 1978   COLONOSCOPY  10/2021   in [place]   DIRECT LARYNGOSCOPY Bilateral 03/27/2024   Procedure: LARYNGOSCOPY, DIRECT;  Surgeon: Susan Eldora NOVAK, MD;  [facility]: [facility];  Service: ENT;  Laterality: Bilateral;   IR US  GUIDE BX ASP/DRAIN  03/21/2024   LAPAROSCOPY     x 2 for endometriosis   REVERSE SHOULDER ARTHROPLASTY Left 05/29/2019   Procedure: REVERSE SHOULDER ARTHROPLASTY;  Surgeon: Susan Bonner DASEN, MD;  Location: [facility];  Service: Orthopedics;  Laterality: Left;   right shoulder arthroscopy Right    TMJ ARTHROSCOPY     TONSILLECTOMY Right 03/27/2024   Procedure: TONSILLECTOMY, PARTIAL;  Surgeon: Susan Eldora NOVAK, MD;  Location: [facility] OR;  Service: ENT;  Laterality: Right;   Patient Active Problem List   Diagnosis Date Noted   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  Objective: See below.  Goal Update: See below.  Plan: See pt for 2-3 more sessions  Reason Skilled Services are Required: Pt will cont to need to be seen by SLP in order to assess safety of PO intake, assess the need for recommending any objective swallow assessment, and ensuring pt is correctly completing the individualized HEP.   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 01/14/24 She presented to the ED with right sided facial swelling. A CT neck was performed which showed swelling and heterogeneous enhancement of the right parotid gland suggestive of parotitis along with adjacent edema within the upper neck and tracking along the mylohyoid muscle on the right, and a mildly enlarged right level II cystic/necrotic lymph node. Other findings of potential clinical significance included bilateral maxillary  sinus mucosal thickening (mild on the right and moderate on the left), and cervical and thoracic spondylosis. Her imaging findings were favored to be infectious in etiology and she was prescribed a course of abx and anti-inflammatories at discharge. She relayed her ED visit to her PCP recommended an interval repeat imaging of the neck. 02/05/24 CT neck demonstrated: the enlarged right level 2A cervical node measuring 1.4 x 1.3 x 1.6 cm with central hypoattenuation, concerning  for central necrosis, and subtle increased density of the right parotid gland without associated inflammatory stranding or mass present. Imaging otherwise showed no masses or abnormal enhancements along the aerodigestive structures. She was referred to ENT. 02/19/24 She saw Dr. Tobie. Her right sided facial swelling had resolved by the time of this visit and she denied any head or neck symptoms overall. Physical examination performed during this visit however confirmed a slightly palpable right level 2 neck mass. No other abnormalities were appreciated on examination. A laryngoscopy was also performed at that time which was unremarkable other than slight prominence of the right lingual tonsil. 02/21/24 US  guided biopsy of right neck. Pathology showed findings consistent with SCC, p 16 +.03/06/24 PET demonstrated: the right level IIA lymph node measuring 1.5 cm compatible with malignancy, and mildly asymmetric activity along the right palatoglossal arch/anterior pillar of fauces. No other asymmetric findings or significant activity was demonstrated elsewhere in the body to suggest a primary site. 03/15/24 Consult with Dr. Izell. She will receive radiation only. 03/27/24 Direct Laryngoscopy biopsy with Dr. Tobie which showed SCC to her right glossotonsillar sulcus. Treatment plan: She will receive 35 fractions of radiation to her Oropharynx and bilateral neck. She started on 04/23/24 and will complete 06/11/24.  PAIN:  Are you having pain? No  FALLS: Has patient fallen in last 6 months?  No   PATIENT GOALS: Maintain swallowing  OBJECTIVE:  Note: Objective measures were completed at Evaluation unless otherwise noted.                                                                                                                             TREATMENT DATE:  [name] Against Resistance=CTAR  07/03/23: Pt told SLP benefits of a food journal as well as three s/sx aspiration PNA. She performed HEP with mod I,   performing HEP at recommended frequency and scope. SLP reiterated pt should be doing x30 reps/day of exercises that require her to swallow x5 days/week.  Pam ate crackers and drank thin liquids without any overt s/sx oropharyngeal dysphagia. Currently she is eating softer foods, mostly. She agreed to another appointment in about four weeks.   05/30/24: Soft solids are easier to consume than protein shakes due to gagging with protein shakes. See S - she politely refused solids. No overt s/sx oropharyngeal deficits reported with solids; Today pt drank water  without any oral deficits or s/sx pharyngeal deficits. She told SLP rationale for HEP with mod I. She had question if she was  completing Mendelsohn correctly and SLP palpated pt's thyroid during 2 reps of Mendelsohn to confirm pt was completing correctly. She is performing 15-20/day with effortful, Masako, and Mendelsohn, and is only able to complete Shaker with 15-20 second hold times, but pt is completing more than recommended reps for this/day. SLP stressed at LEAST 20 reps/day of those exercises that she has to swallow for. SLP taught pt CTAR today to substitute for Shaker if desired, or to add to regimen if desired. SLP reiterated education last session re: pt will need to do what she can with HEP if it becomes hard to swallow until next session.   05/02/24: Research states the risk for dysphagia increases due to radiation and/or chemotherapy treatment due to a variety of factors, so SLP educated the pt about the possibility of reduced/limited ability for PO intake during rad tx. SLP also educated pt regarding possible changes to swallowing musculature after rad tx, and why adherence to dysphagia HEP provided today and PO consumption was necessary to inhibit muscle fibrosis following rad tx and to mitigate muscle disuse atrophy. SLP informed pt why this would be detrimental to their swallowing status and to their pulmonary health. Pt demonstrated  understanding of these things to SLP. SLP encouraged pt to safely eat and drink as deep into their radiation/chemotherapy as possible to provide the best possible long-term swallowing outcome for pt.  SLP then developed an individualized HEP for pt involving oral and pharyngeal strengthening and ROM and pt was instructed how to perform these exercises, including SLP demonstration. After SLP demonstration, pt return demonstrated each exercise. SLP ensured pt performance was correct prior to educating pt on next exercise. Pt required usual mod cues faded to modified independent to perform HEP. Pt was instructed to complete this program 5-7 days/week, at least 20 reps a day until 6 months after his or her last day of rad tx, and then x2 a week after that, indefinitely. Among other modifications for days when pt cannot functionally swallow, SLP also suggested pt to perform only non-swallowing tasks on the handout/HEP, and if necessary to cycle through the swallowing portion so the full program of exercises can be completed instead of fatiguing on one of the swallowing exercises and being unable to perform the other swallowing exercises. SLP instructed that swallowing exercises should then be added back into the regimen as pt is able to do so.   PATIENT EDUCATION: Education details: late effects head/neck radiation on swallow function, HEP procedure, and modification to HEP when difficulty experienced with swallowing during and after radiation course Person educated: Patient Education method: Explanation, Demonstration, and Verbal cues Education comprehension: verbalized understanding, returned demonstration, verbal cues required, and needs further education   ASSESSMENT:  CLINICAL IMPRESSION: RECERT TODAY. Patient is a 74 y.o. F who was seen today for treatment of swallowing after undergoing radiation/chemoradiation therapy. Today pt ate crackers/chips and drank thin liquids without overt s/s oral or  pharyngeal difficulty. At this time pt swallowing is deemed WNL/WFL with these POs. There are no overt s/s aspiration PNA observed by SLP nor any reported by pt at this time. Data indicate that pt's swallow ability will likely decrease over the course of radiation/chemoradiation therapy and could very well decline over time following the conclusion of that therapy due to muscle disuse atrophy and/or muscle fibrosis. Pt will cont to need to be seen by SLP in order to assess safety of PO intake, assess the need for recommending any objective swallow assessment, and ensuring pt  is correctly completing the individualized HEP.  OBJECTIVE IMPAIRMENTS: include dysphagia. These impairments are limiting patient from safety when swallowing. Factors affecting potential to achieve goals and functional outcome are none noted today. Patient will benefit from skilled SLP services to address above impairments and improve overall function.   REHAB POTENTIAL: Good     GOALS: Goals reviewed with patient? No   SHORT TERM GOALS: Target: 3rd total session   1. Pt will complete HEP with modified independence in 2 sessions Baseline: Goal status: partially met    2.  pt will tell SLP why pt is completing HEP with modified independence Baseline:  Goal status: met   3.  pt will describe 3 overt s/s aspiration PNA with modified independence Baseline:  Goal status: met   4.  pt will tell SLP how a food journal could hasten return to a more normalized diet Baseline:  Goal status: met     LONG TERM GOALS: Target: 7th total session   1.  pt will complete HEP with independence over two visits Baseline:  Goal status: INITIAL   2.  pt will describe how to modify HEP over time, and the timeline associated with reduction in HEP frequency with modified independence over two sessions Baseline:  Goal status: INITIAL     PLAN:   SLP FREQUENCY:  once approx every 4 weeks   SLP DURATION:  7 sessions   PLANNED  INTERVENTIONS: Aspiration precaution training, Pharyngeal strengthening exercises, Diet toleration management , Trials of upgraded texture/liquids, SLP instruction and feedback, Compensatory strategies, and Patient/family education, [phone] (treatment of swallowing dysfunction and/or oral function for feeding)   Menashe Kafer, CCC-SLP 07/02/2024, 9:47 AM      "

## 2024-08-05 ENCOUNTER — Ambulatory Visit: Payer: PRIVATE HEALTH INSURANCE | Attending: Radiation Oncology

## 2024-09-10 ENCOUNTER — Ambulatory Visit: Payer: PRIVATE HEALTH INSURANCE | Admitting: Radiation Oncology

## 2024-10-21 ENCOUNTER — Ambulatory Visit (INDEPENDENT_AMBULATORY_CARE_PROVIDER_SITE_OTHER): Payer: PRIVATE HEALTH INSURANCE | Admitting: Otolaryngology
